# Patient Record
Sex: Female | Born: 1990 | Race: White | Hispanic: No | Marital: Married | Smoking: Never smoker
Health system: Southern US, Community
[De-identification: ages and names within clinical notes are randomized; demographics above are authoritative.]

## PROBLEM LIST (undated history)

## (undated) DIAGNOSIS — J45909 Unspecified asthma, uncomplicated: Secondary | ICD-10-CM

## (undated) HISTORY — PX: ADENOIDECTOMY: SUR15

## (undated) HISTORY — PX: TONSILLECTOMY: SUR1361

## (undated) HISTORY — DX: Unspecified asthma, uncomplicated: J45.909

---

## 2019-08-02 NOTE — L&D Delivery Note (Signed)
OB/GYN Faculty Practice Delivery Note  Lori Harris is a 29 y.o. G1P0 s/p vaginal delivery at [redacted]w[redacted]d. She was admitted for IOL secondary to chTN with superimposed preeclampsia with severe features (by blood pressures).   ROM: 14h 23m with clear fluid GBS Status: negative Maximum Maternal Temperature: 98.25F  Labor Progress: On arrival to L&D, pt received cytotec and FB placement for cervical ripening. Magnesium was started given persistent severe range blood pressures shortly after arrival. Pt was transitioned to pitocin at 0100 on 05/12/20 and then AROM for clear fluid was performed at 0715. Pt continued to progress well and cervical exam was found to be complete at 1244. She then had an uncomplicated delivery as noted below.   Delivery Date/Time: 2206 on 05/12/20 Delivery: Called to room and patient was complete and pushing. Head delivered LOA. No nuchal cord present. Shoulder and body delivered in usual fashion. Infant with spontaneous cry, placed on mother's abdomen, dried and stimulated. Cord clamped x 2 after 1-minute delay, and cut by FOB under my direct supervision. Cord blood drawn. Placenta delivered spontaneously with gentle cord traction. Fundus firm with massage and Pitocin. Labia, perineum, vagina, and cervix were inspected, notable for a second degree perineal laceration. Administered rectal cytotec in addition to postpartum pitocin s/p laceration repair given slight oozing in s/o IV magnesium.  Placenta: intact with trailing membranes, 2-vessel cord, sent to L&D Complications: none Lacerations: 2nd degree perineal laceration, s/p standard repair with 3-0 vicryl EBL: 450 ml Analgesia: epidural  Infant: female  APGARs  8 & 8  3260g  Lynnda Shields, MD OB/GYN Fellow, Faculty Practice

## 2019-10-22 ENCOUNTER — Encounter: Payer: Self-pay | Admitting: *Deleted

## 2019-10-23 ENCOUNTER — Other Ambulatory Visit: Payer: Self-pay

## 2019-10-23 ENCOUNTER — Ambulatory Visit (INDEPENDENT_AMBULATORY_CARE_PROVIDER_SITE_OTHER): Payer: 59 | Admitting: Obstetrics and Gynecology

## 2019-10-23 ENCOUNTER — Encounter: Payer: Self-pay | Admitting: Obstetrics and Gynecology

## 2019-10-23 DIAGNOSIS — Z1151 Encounter for screening for human papillomavirus (HPV): Secondary | ICD-10-CM

## 2019-10-23 DIAGNOSIS — Z3401 Encounter for supervision of normal first pregnancy, first trimester: Secondary | ICD-10-CM

## 2019-10-23 DIAGNOSIS — O99891 Other specified diseases and conditions complicating pregnancy: Secondary | ICD-10-CM | POA: Diagnosis not present

## 2019-10-23 DIAGNOSIS — Z124 Encounter for screening for malignant neoplasm of cervix: Secondary | ICD-10-CM

## 2019-10-23 DIAGNOSIS — R030 Elevated blood-pressure reading, without diagnosis of hypertension: Secondary | ICD-10-CM | POA: Insufficient documentation

## 2019-10-23 DIAGNOSIS — Z3A09 9 weeks gestation of pregnancy: Secondary | ICD-10-CM | POA: Diagnosis not present

## 2019-10-23 DIAGNOSIS — Z3490 Encounter for supervision of normal pregnancy, unspecified, unspecified trimester: Secondary | ICD-10-CM | POA: Insufficient documentation

## 2019-10-23 DIAGNOSIS — Z113 Encounter for screening for infections with a predominantly sexual mode of transmission: Secondary | ICD-10-CM | POA: Diagnosis not present

## 2019-10-23 MED ORDER — ALBUTEROL SULFATE HFA 108 (90 BASE) MCG/ACT IN AERS: 1.0000 | INHALATION_SPRAY | Freq: Four times a day (QID) | RESPIRATORY_TRACT | 3 refills | Status: DC | PRN

## 2019-10-23 NOTE — Progress Notes (Signed)
Pt does state that her periods are about every 58 days and may have 6 cycles per year.  Bedside U/S shows single IUP with FHT of 176 BPM and CRL measures 23.20mm  GA is 9w

## 2019-10-23 NOTE — Progress Notes (Signed)
BP recheck 118/79  P-106

## 2019-10-23 NOTE — Patient Instructions (Signed)

## 2019-10-23 NOTE — Progress Notes (Signed)
History:   Lori Harris is a 29 y.o. G1P0 at [redacted]w[redacted]d by early ultrasound being seen today for her first obstetrical visit.  Her obstetrical history is significant for elevated BP without the diagnosis of HTN., obesity. Patient does intend to breast feed. Pregnancy history fully reviewed. Has never had a pap smear.   Patient reports no complaints.  HISTORY: OB History  Gravida Para Term Preterm AB Living  1 0 0 0 0 0  SAB TAB Ectopic Multiple Live Births  0 0 0 0 0    # Outcome Date GA Lbr Len/2nd Weight Sex Delivery Anes PTL Lv  1 Current             Last pap smear was done Never  Past Medical History:  Diagnosis Date  . Asthma    Past Surgical History:  Procedure Laterality Date  . TONSILLECTOMY     Family History  Problem Relation Age of Onset  . Diabetes Mother   . Heart disease Mother   . Hypothyroidism Mother   . Diabetes Maternal Grandmother   . Spina bifida Maternal Grandfather   . Hypothyroidism Maternal Grandfather   . Hypothyroidism Maternal Aunt   . Hypothyroidism Maternal Aunt    Social History   Tobacco Use  . Smoking status: Never Smoker  . Smokeless tobacco: Never Used  Substance Use Topics  . Alcohol use: Not Currently  . Drug use: Never   No Known Allergies Current Outpatient Medications on File Prior to Visit  Medication Sig Dispense Refill  . cetirizine (ZYRTEC) 10 MG tablet Take 10 mg by mouth daily.    . Pediatric Multiple Vit-C-FA (CHILDRENS MULTIVITAMIN PO) Take by mouth.    . Prenatal Vit-Fe Fumarate-FA (PRENATAL VITAMINS PO) Take by mouth.     No current facility-administered medications on file prior to visit.    Review of Systems Pertinent items noted in HPI and remainder of comprehensive ROS otherwise negative. Physical Exam:   Vitals:   10/23/19 1032 10/23/19 1034 10/23/19 1126  BP: (!) 144/77  118/79  Pulse: (!) 105  (!) 106  Temp: 97.9 F (36.6 C)    Weight: 247 lb (112 kg)    Height:  6' (1.829 m)    Fetal  Heart Rate (bpm): 176  BP 118/79   Pulse (!) 106   Temp 97.9 F (36.6 C)   Ht 6' (1.829 m)   Wt 247 lb (112 kg)   LMP 08/05/2019   BMI 33.50 kg/m  Uterine Size: size equals dates  Pelvic Exam:    Perineum: No Hemorrhoids, Normal Perineum   Vulva: normal   Vagina:  normal mucosa, normal discharge, no palpable nodules   pH: Not done   Cervix: no bleeding following Pap, no cervical motion tenderness and no lesions  System: Breast:  No nipple retraction or dimpling, No nipple discharge or bleeding, No axillary or supraclavicular adenopathy, Normal to palpation without dominant masses   Skin: normal coloration and turgor, no rashes    Neurologic: negative   Extremities: normal strength, tone, and muscle mass   HEENT neck supple with midline trachea and thyroid without masses   Mouth/Teeth mucous membranes moist, pharynx normal without lesions   Neck supple and no masses   Cardiovascular: regular rate and rhythm, no murmurs or gallops   Respiratory:  appears well, vitals normal, no respiratory distress, acyanotic, normal RR, neck free of mass or lymphadenopathy, chest clear, no wheezing, crepitations, rhonchi, normal symmetric air entry   Abdomen: soft,  non-tender; bowel sounds normal; no masses,  no organomegaly   Urinary: urethral meatus normal   Assessment:    Pregnancy: G1P0 Patient Active Problem List   Diagnosis Date Noted  . Encounter for supervision of normal pregnancy 10/23/2019  . Elevated BP without diagnosis of hypertension 10/23/2019     Plan:   1. Encounter for supervision of normal first pregnancy in first trimester  - Obstetric panel - HIV antibody (with reflex) - Hepatitis C Antibody - Culture, OB Urine - Cytology - PAP( [place]) - TSH - Comprehensive metabolic panel - Protein / creatinine ratio, urine - HgB A1c - Hemoglobinopathy Evaluation - Patient to return to the office for labs in 2 weeks.   2. Elevated BP without diagnosis of  hypertension  Elevated BP's in the past per the patient Initial BP elevated, subsequent normal BASA daily Baseline labs today.    Initial labs drawn. Continue prenatal vitamins. Genetic Screening discussed, NIPS: requested. Ultrasound discussed; fetal anatomic survey: requested. Problem list reviewed and updated. The nature of [place] with multiple MDs and other Advanced Practice Providers was explained to patient; also emphasized that residents, students are part of our team. Routine obstetric precautions reviewed. No follow-ups on file.    Arlow Spiers, Artist Pais, [place] for [employer], [place]

## 2019-10-25 LAB — CYTOLOGY - PAP
Chlamydia: NEGATIVE
Comment: NEGATIVE
Comment: NEGATIVE
Comment: NORMAL
Diagnosis: NEGATIVE
Diagnosis: REACTIVE
High risk HPV: NEGATIVE
Neisseria Gonorrhea: NEGATIVE

## 2019-11-06 ENCOUNTER — Ambulatory Visit (INDEPENDENT_AMBULATORY_CARE_PROVIDER_SITE_OTHER): Payer: 59

## 2019-11-06 ENCOUNTER — Other Ambulatory Visit: Payer: Self-pay

## 2019-11-06 DIAGNOSIS — Z3490 Encounter for supervision of normal pregnancy, unspecified, unspecified trimester: Secondary | ICD-10-CM

## 2019-11-06 NOTE — Progress Notes (Signed)
Routine OB blood drawn. Pt will return at a later date for Panorama.

## 2019-11-08 LAB — HEMOGLOBINOPATHY EVALUATION
Fetal Hemoglobin Testing: <1 % (ref 0.0–1.9)
HCT: 41.2 % (ref 35.0–45.0)
Hemoglobin A2 - HGBRFX: 2.5 % (ref 2.2–3.2)
Hemoglobin: 13.3 g/dL (ref 11.7–15.5)
Hgb A: 97.5 % (ref >96.0)
MCH: 30.4 pg (ref 27.0–33.0)
MCV: 94.3 fL (ref 80.0–100.0)
RBC: 4.37 10*6/uL (ref 3.80–5.10)
RDW: 13 % (ref 11.0–15.0)

## 2019-11-08 LAB — OBSTETRIC PANEL
Absolute Monocytes: 231 cells/uL (ref 200–950)
Antibody Screen: NOT DETECTED
Basophils Absolute: 9 cells/uL (ref 0–200)
Basophils Relative: 0.1 %
Eosinophils Absolute: 53 cells/uL (ref 15–500)
Eosinophils Relative: 0.6 %
HCT: 38.8 % (ref 35.0–45.0)
Hemoglobin: 13 g/dL (ref 11.7–15.5)
Hepatitis B Surface Ag: NONREACTIVE
Lymphs Abs: 1513 cells/uL (ref 850–3900)
MCH: 30.1 pg (ref 27.0–33.0)
MCHC: 33.5 g/dL (ref 32.0–36.0)
MCV: 89.8 fL (ref 80.0–100.0)
MPV: 10.8 fL (ref 7.5–12.5)
Monocytes Relative: 2.6 %
Neutro Abs: 7093 cells/uL (ref 1500–7800)
Neutrophils Relative %: 79.7 %
Platelets: 239 10*3/uL (ref 140–400)
RBC: 4.32 10*6/uL (ref 3.80–5.10)
RDW: 12.3 % (ref 11.0–15.0)
RPR Ser Ql: NONREACTIVE
Rubella: <0.9 Index — ABNORMAL LOW
Total Lymphocyte: 17 %
WBC: 8.9 10*3/uL (ref 3.8–10.8)

## 2019-11-08 LAB — HEPATITIS C ANTIBODY
Hepatitis C Ab: NONREACTIVE
SIGNAL TO CUT-OFF: 0.01 (ref <1.00)

## 2019-11-08 LAB — CULTURE, OB URINE

## 2019-11-08 LAB — COMPREHENSIVE METABOLIC PANEL
AG Ratio: 1.7 (calc) (ref 1.0–2.5)
ALT: 24 U/L (ref 6–29)
AST: 17 U/L (ref 10–30)
Albumin: 4.3 g/dL (ref 3.6–5.1)
Alkaline phosphatase (APISO): 73 U/L (ref 31–125)
BUN/Creatinine Ratio: 9 (calc) (ref 6–22)
BUN: 5 mg/dL — ABNORMAL LOW (ref 7–25)
CO2: 24 mmol/L (ref 20–32)
Calcium: 9.7 mg/dL (ref 8.6–10.2)
Chloride: 100 mmol/L (ref 98–110)
Creat: 0.53 mg/dL (ref 0.50–1.10)
Globulin: 2.6 g/dL (calc) (ref 1.9–3.7)
Glucose, Bld: 117 mg/dL — ABNORMAL HIGH (ref 65–99)
Potassium: 3.8 mmol/L (ref 3.5–5.3)
Sodium: 134 mmol/L — ABNORMAL LOW (ref 135–146)
Total Bilirubin: 0.5 mg/dL (ref 0.2–1.2)
Total Protein: 6.9 g/dL (ref 6.1–8.1)

## 2019-11-08 LAB — HIV ANTIBODY (ROUTINE TESTING W REFLEX): HIV 1&2 Ab, 4th Generation: NONREACTIVE

## 2019-11-08 LAB — HEMOGLOBIN A1C
Hgb A1c MFr Bld: 4.6 % of total Hgb (ref <5.7)
Mean Plasma Glucose: 85 (calc)
eAG (mmol/L): 4.7 (calc)

## 2019-11-08 LAB — PROTEIN / CREATININE RATIO, URINE
Creatinine, Urine: 189 mg/dL (ref 20–275)
Protein/Creat Ratio: 275 mg/g creat — ABNORMAL HIGH (ref 21–161)
Protein/Creatinine Ratio: 0.275 mg/mg creat — ABNORMAL HIGH (ref 0.021–0.16)
Total Protein, Urine: 52 mg/dL — ABNORMAL HIGH (ref 5–24)

## 2019-11-08 LAB — TSH: TSH: 1.6 mIU/L

## 2019-11-08 LAB — URINE CULTURE, OB REFLEX

## 2019-11-17 ENCOUNTER — Encounter: Payer: Self-pay | Admitting: Obstetrics and Gynecology

## 2019-11-17 DIAGNOSIS — Z283 Underimmunization status: Secondary | ICD-10-CM | POA: Insufficient documentation

## 2019-11-17 DIAGNOSIS — O99891 Other specified diseases and conditions complicating pregnancy: Secondary | ICD-10-CM | POA: Insufficient documentation

## 2019-11-17 DIAGNOSIS — O09899 Supervision of other high risk pregnancies, unspecified trimester: Secondary | ICD-10-CM | POA: Insufficient documentation

## 2019-11-20 ENCOUNTER — Ambulatory Visit (INDEPENDENT_AMBULATORY_CARE_PROVIDER_SITE_OTHER): Payer: 59 | Admitting: Obstetrics and Gynecology

## 2019-11-20 ENCOUNTER — Other Ambulatory Visit: Payer: Self-pay

## 2019-11-20 VITALS — BP 129/80 | HR 100 | Temp 98.1°F | Wt 244.0 lb

## 2019-11-20 DIAGNOSIS — Z3401 Encounter for supervision of normal first pregnancy, first trimester: Secondary | ICD-10-CM

## 2019-11-20 NOTE — Progress Notes (Signed)
   PRENATAL VISIT NOTE  Subjective:  Lori Harris is a 29 y.o. G1P0 at [redacted]w[redacted]d being seen today for ongoing prenatal care.  She is currently monitored for the following issues for this low-risk pregnancy and has Encounter for supervision of normal pregnancy; Elevated BP without diagnosis of hypertension; and Rubella non-immune status, antepartum on their problem list.  Patient reports no complaints.  Contractions: Not present. Vag. Bleeding: None.  Movement: Absent. Denies leaking of fluid.   The following portions of the patient's history were reviewed and updated as appropriate: allergies, current medications, past family history, past medical history, past social history, past surgical history and problem list.   Objective:   Vitals:   11/20/19 0930  BP: 129/80  Pulse: 100  Temp: 98.1 F (36.7 C)  Weight: 244 lb (110.7 kg)    Fetal Status: Fetal Heart Rate (bpm): 161   Movement: Absent     General:  Alert, oriented and cooperative. Patient is in no acute distress.  Skin: Skin is warm and dry. No rash noted.   Cardiovascular: Normal heart rate noted  Respiratory: Normal respiratory effort, no problems with respiration noted  Abdomen: Soft, gravid, appropriate for gestational age.  Pain/Pressure: Absent     Pelvic: Cervical exam deferred        Extremities: Normal range of motion.  Edema: None  Mental Status: Normal mood and affect. Normal behavior. Normal judgment and thought content.   Assessment and Plan:  Pregnancy: G1P0 at [redacted]w[redacted]d  1. Encounter for supervision of normal first pregnancy in first trimester  - [place] MFM OB DETAIL +14 WK; Future - NIPS/[place] today   Preterm labor symptoms and general obstetric precautions including but not limited to vaginal bleeding, contractions, leaking of fluid and fetal movement were reviewed in detail with the patient. Please refer to After Visit Summary for other counseling recommendations.   Return in about 4 weeks (around  12/18/2019) for [place] order placed .  No future appointments.  Venia Carbon, NP

## 2019-11-28 DIAGNOSIS — Z3401 Encounter for supervision of normal first pregnancy, first trimester: Secondary | ICD-10-CM

## 2019-12-17 ENCOUNTER — Other Ambulatory Visit: Payer: Self-pay

## 2019-12-17 ENCOUNTER — Ambulatory Visit (INDEPENDENT_AMBULATORY_CARE_PROVIDER_SITE_OTHER): Payer: 59 | Admitting: Advanced Practice Midwife

## 2019-12-17 VITALS — BP 116/74 | HR 112 | Temp 97.5°F | Ht 60.0 in | Wt 244.0 lb

## 2019-12-17 DIAGNOSIS — Z3A16 16 weeks gestation of pregnancy: Secondary | ICD-10-CM

## 2019-12-17 DIAGNOSIS — E669 Obesity, unspecified: Secondary | ICD-10-CM | POA: Diagnosis not present

## 2019-12-17 DIAGNOSIS — O99212 Obesity complicating pregnancy, second trimester: Secondary | ICD-10-CM | POA: Diagnosis not present

## 2019-12-17 DIAGNOSIS — Z3402 Encounter for supervision of normal first pregnancy, second trimester: Secondary | ICD-10-CM

## 2019-12-17 MED ORDER — ASPIRIN EC 81 MG PO TBEC: 81.0000 mg | DELAYED_RELEASE_TABLET | Freq: Every day | ORAL | 5 refills | Status: DC

## 2019-12-17 NOTE — Progress Notes (Signed)
   PRENATAL VISIT NOTE  Subjective:  Lori Harris is a 29 y.o. G1P0 at [redacted]w[redacted]d being seen today for ongoing prenatal care.  She is currently monitored for the following issues for this low-risk pregnancy and has Encounter for supervision of normal pregnancy; Elevated BP without diagnosis of hypertension; and Rubella non-immune status, antepartum on their problem list.  Patient reports no complaints.  Contractions: Regular. Vag. Bleeding: None.  Movement: Absent. Denies leaking of fluid.   The following portions of the patient's history were reviewed and updated as appropriate: allergies, current medications, past family history, past medical history, past social history, past surgical history and problem list.   Objective:   Vitals:   12/17/19 1024 12/17/19 1027  BP: 116/74   Pulse: (!) 112   Temp: (!) 97.5 F (36.4 C)   Weight: 244 lb (110.7 kg)   Height:  5' (1.524 m)    Fetal Status: Fetal Heart Rate (bpm): 156   Movement: Absent     General:  Alert, oriented and cooperative. Patient is in no acute distress.  Skin: Skin is warm and dry. No rash noted.   Cardiovascular: Normal heart rate noted  Respiratory: Normal respiratory effort, no problems with respiration noted  Abdomen: Soft, gravid, appropriate for gestational age.  Pain/Pressure: Absent     Pelvic: Cervical exam deferred        Extremities: Normal range of motion.  Edema: None  Mental Status: Normal mood and affect. Normal behavior. Normal judgment and thought content.   Assessment and Plan:  Pregnancy: G1P0 at [redacted]w[redacted]d 1. Encounter for supervision of normal first pregnancy in second trimester --Pt to complete serum genetic screening today. - Alpha fetoprotein, maternal  2. Obesity affecting pregnancy in second trimester --Discussed risk factors for PEC, baby ASA started.  Baseline labs done at initial visit with P/C ratio 0.28.  HTN at first visit, none at subsequent visits. - aspirin EC 81 MG tablet; Take 1  tablet (81 mg total) by mouth daily.  Dispense: 30 tablet; Refill: 5  Preterm labor symptoms and general obstetric precautions including but not limited to vaginal bleeding, contractions, leaking of fluid and fetal movement were reviewed in detail with the patient. Please refer to After Visit Summary for other counseling recommendations.   Return in about 4 weeks (around 01/14/2020).  Future Appointments  Date Time Provider Department Center  01/01/2020 10:30 AM [facility] NURSE [facility]  01/01/2020 10:30 AM [facility] US3 [facility]  01/14/2020 10:35 AM [place], Wilmer Floor, CNM [facility]    Sharen Counter, CNM

## 2019-12-26 LAB — ALPHA FETOPROTEIN, MATERNAL
AFP MoM: 1.29
AFP, Serum: 36.1 ng/mL
Calc'd Gestational Age: 16.9 weeks
Maternal Wt: 244 [lb_av]
Risk for ONTD: 1
Twins-AFP: 1

## 2019-12-26 LAB — TIQ-AOE

## 2020-01-01 ENCOUNTER — Ambulatory Visit: Payer: 59 | Admitting: *Deleted

## 2020-01-01 ENCOUNTER — Other Ambulatory Visit: Payer: Self-pay

## 2020-01-01 ENCOUNTER — Other Ambulatory Visit: Payer: Self-pay | Admitting: *Deleted

## 2020-01-01 ENCOUNTER — Ambulatory Visit (HOSPITAL_COMMUNITY): Payer: 59 | Attending: Obstetrics and Gynecology

## 2020-01-01 VITALS — BP 129/76 | HR 105

## 2020-01-01 DIAGNOSIS — Z3A19 19 weeks gestation of pregnancy: Secondary | ICD-10-CM

## 2020-01-01 DIAGNOSIS — O132 Gestational [pregnancy-induced] hypertension without significant proteinuria, second trimester: Secondary | ICD-10-CM | POA: Diagnosis not present

## 2020-01-01 DIAGNOSIS — Z283 Underimmunization status: Secondary | ICD-10-CM | POA: Insufficient documentation

## 2020-01-01 DIAGNOSIS — O99212 Obesity complicating pregnancy, second trimester: Secondary | ICD-10-CM

## 2020-01-01 DIAGNOSIS — Z362 Encounter for other antenatal screening follow-up: Secondary | ICD-10-CM

## 2020-01-01 DIAGNOSIS — Z363 Encounter for antenatal screening for malformations: Secondary | ICD-10-CM

## 2020-01-01 DIAGNOSIS — E669 Obesity, unspecified: Secondary | ICD-10-CM

## 2020-01-01 DIAGNOSIS — Z3401 Encounter for supervision of normal first pregnancy, first trimester: Secondary | ICD-10-CM | POA: Diagnosis present

## 2020-01-01 DIAGNOSIS — O09899 Supervision of other high risk pregnancies, unspecified trimester: Secondary | ICD-10-CM

## 2020-01-01 DIAGNOSIS — O99891 Other specified diseases and conditions complicating pregnancy: Secondary | ICD-10-CM | POA: Diagnosis present

## 2020-01-01 DIAGNOSIS — Z8279 Family history of other congenital malformations, deformations and chromosomal abnormalities: Secondary | ICD-10-CM

## 2020-01-14 ENCOUNTER — Telehealth (INDEPENDENT_AMBULATORY_CARE_PROVIDER_SITE_OTHER): Payer: 59 | Admitting: Advanced Practice Midwife

## 2020-01-14 DIAGNOSIS — R030 Elevated blood-pressure reading, without diagnosis of hypertension: Secondary | ICD-10-CM

## 2020-01-14 DIAGNOSIS — Z283 Underimmunization status: Secondary | ICD-10-CM

## 2020-01-14 DIAGNOSIS — O99212 Obesity complicating pregnancy, second trimester: Secondary | ICD-10-CM

## 2020-01-14 DIAGNOSIS — Z2839 Other underimmunization status: Secondary | ICD-10-CM

## 2020-01-14 DIAGNOSIS — Z3A20 20 weeks gestation of pregnancy: Secondary | ICD-10-CM

## 2020-01-14 DIAGNOSIS — Z3490 Encounter for supervision of normal pregnancy, unspecified, unspecified trimester: Secondary | ICD-10-CM

## 2020-01-14 DIAGNOSIS — O09899 Supervision of other high risk pregnancies, unspecified trimester: Secondary | ICD-10-CM

## 2020-01-14 DIAGNOSIS — E669 Obesity, unspecified: Secondary | ICD-10-CM

## 2020-01-14 DIAGNOSIS — O99891 Other specified diseases and conditions complicating pregnancy: Secondary | ICD-10-CM

## 2020-01-14 NOTE — Progress Notes (Signed)
OBSTETRICS PRENATAL VIRTUAL VISIT ENCOUNTER NOTE  Provider location: [facility] [place]   I connected with Sharyn Creamer on 01/14/20 at 10:35 AM EDT by MyChart Video Encounter at home and verified that I am speaking with the correct person using two identifiers.   I discussed the limitations, risks, security and privacy concerns of performing an evaluation and management service virtually and the availability of in person appointments. I also discussed with the patient that there may be a patient responsible charge related to this service. The patient expressed understanding and agreed to proceed. Subjective:  Lori Harris is a 29 y.o. G1P0 at [redacted]w[redacted]d being seen today for ongoing prenatal care.  She is currently monitored for the following issues for this low-risk pregnancy and has Encounter for supervision of normal pregnancy; Elevated BP without diagnosis of hypertension; Rubella non-immune status, antepartum; and Obesity affecting pregnancy in second trimester on their problem list.  Patient reports nausea.  Contractions: Not present. Vag. Bleeding: None.   . Denies any leaking of fluid.   The following portions of the patient's history were reviewed and updated as appropriate: allergies, current medications, past family history, past medical history, past social history, past surgical history and problem list.   Objective:  There were no vitals filed for this visit.  Fetal Status:           General:  Alert, oriented and cooperative. Patient is in no acute distress.  Respiratory: Normal respiratory effort, no problems with respiration noted  Mental Status: Normal mood and affect. Normal behavior. Normal judgment and thought content.  Rest of physical exam deferred due to type of encounter  Imaging: [place] MFM OB DETAIL +14 WK  Result Date: 01/01/2020 ----------------------------------------------------------------------  OBSTETRICS REPORT                        (Signed Final 01/01/2020 12:02 pm) ---------------------------------------------------------------------- Patient Info  ID #:       [id number]                          D.O.B.:  1991/03/19 (29 yrs)  Name:       Lori Harris             Visit Date: 01/01/2020 10:31 am ---------------------------------------------------------------------- Performed By  Attending:        Ma Rings MD         Ref. Address:     [address] [place] [address], [place]  Performed By:     Tommie Raymond BS,       [facility]                    RDMS, RVT                                Fetal Care  Referred By:      Everardo All ---------------------------------------------------------------------- Orders  #  Description  Code        Ordered By  1  [place] MFM OB DETAIL +14 WK               [id number]    JENNIFER [facility] ----------------------------------------------------------------------  #  Order #                     Accession #                Episode #  1  [id number]                   [id number]                 [id number] ---------------------------------------------------------------------- Indications  Elevated blood pressure affecting pregnancy    O13.2  in second trimester  Obesity complicating pregnancy, second         O99.212  trimester  [redacted] weeks gestation of pregnancy                Z3A.19  Encounter for antenatal screening for          Z36.3  malformations (LR NIPS)  Family history of congenital anomaly (spina    Z82.79  bifida, hypospadias) ---------------------------------------------------------------------- Fetal Evaluation  Num Of Fetuses:         1  Fetal Heart Rate(bpm):  155  Cardiac Activity:       Observed  Presentation:           Variable  Placenta:               Posterior Fundal  P. Cord Insertion:      Visualized, central  Amniotic Fluid  AFI FV:      Within normal limits                               Largest Pocket(cm)                              4.6 ---------------------------------------------------------------------- Biometry  BPD:      46.4  mm     G. Age:  20w 0d         88  %    CI:        75.53   %    70 - 86                                                          FL/HC:      17.5   %    16.1 - 18.3  HC:      169.3  mm     G. Age:  19w 4d         70  %    HC/AC:      1.08        1.09 - 1.39  AC:      156.2  mm     G. Age:  20w 5d         93  %    FL/BPD:     63.8   %  FL:       29.6  mm     G. Age:  19w 1d  48  %    FL/AC:      19.0   %    20 - 24  HUM:      28.8  mm     G. Age:  19w 2d         59  %  CER:      20.7  mm     G. Age:  19w 5d         66  %  NFT:       3.9  mm  LV:        5.8  mm  CM:        3.5  mm  Est. FW:     324  gm    0 lb 11 oz      93  % ---------------------------------------------------------------------- OB History  Blood Type:   A+  Gravidity:    1         Term:   0        Prem:   0        SAB:   0  TOP:          0       Ectopic:  0        Living: 0 ---------------------------------------------------------------------- Gestational Age  LMP:           21w 2d        Date:  08/05/19                 EDD:   05/11/20  U/S Today:     19w 6d                                        EDD:   05/21/20  Best:          19w 0d     Det. By:  Previous Ultrasound      EDD:   05/27/20                                      (10/23/19) ---------------------------------------------------------------------- Anatomy  Cranium:               Appears normal         Aortic Arch:            Appears normal  Cavum:                 Appears normal         Ductal Arch:            Appears normal  Ventricles:            Appears normal         Diaphragm:              Appears normal  Choroid Plexus:        Appears normal         Stomach:                Appears normal, left  sided  Cerebellum:            Appears normal         Abdomen:                 Appears normal  Posterior Fossa:       Appears normal         Abdominal Wall:         Appears nml (cord                                                                        insert, abd wall)  Nuchal Fold:           Appears normal         Cord Vessels:           Appears normal (3                                                                        vessel cord)  Face:                  Appears normal         Kidneys:                Appear normal                         (orbits and profile)  Lips:                  Appears normal         Bladder:                Appears normal  Thoracic:              Appears normal         Spine:                  Not well visualized  Heart:                 Appears normal         Upper Extremities:      Appears normal                         (4CH, axis, and                         situs)  RVOT:                  Appears normal         Lower Extremities:      Appears normal  LVOT:                  Appears normal  Other:  Fetus appears to be a female. Nasal bone visualized. Heels and 5th          digit  visualized. Open hands visualized. Technically difficult due to          maternal habitus and fetal position. ---------------------------------------------------------------------- Cervix Uterus Adnexa  Cervix  Length:           4.02  cm.  Normal appearance by transabdominal scan.  Uterus  No abnormality visualized.  Right Ovary  Within normal limits. No adnexal mass visualized.  Left Ovary  Within normal limits. No adnexal mass visualized.  Cul De Sac  No free fluid seen.  Adnexa  No abnormality visualized. ---------------------------------------------------------------------- Comments  This patient was seen for a detailed fetal anatomy scan due  to maternal obesity and history of chronic hypertension that is  not currently treated with any medications.  She denies any other significant past medical history and  denies any problems in her current pregnancy.  She had a cell free DNA test  earlier in her pregnancy which  indicated a low risk for trisomy 79, 81, and 13. A female fetus is  predicted.  She was informed that the fetal growth and amniotic fluid  level were appropriate for her gestational age.  There were no obvious fetal anomalies noted on today's  ultrasound exam.  However, the views of the fetal anatomy  were limited today due to maternal body habitus and the fetal  position..  The patient was informed that anomalies may be missed due  to technical limitations. If the fetus is in a suboptimal position  or maternal habitus is increased, visualization of the fetus in  the maternal uterus may be impaired.  Due to her history of chronic hypertension, we will continue to  follow her with serial growth ultrasounds.  A follow-up exam was scheduled in 4 weeks. ----------------------------------------------------------------------                   Johnell Comings, MD Electronically Signed Final Report   01/01/2020 12:02 pm ----------------------------------------------------------------------   Assessment and Plan:  Pregnancy: G1P0 at [redacted]w[redacted]d 1. Rubella non-immune status, antepartum   2. Encounter for supervision of normal pregnancy, antepartum, unspecified gravidity --Pt reports good fetal movement, denies cramping, LOF, or vaginal bleeding --Anticipatory guidance about next visits/weeks of pregnancy given. --Reviewed normal anatomy US, follow up for growth in 2 weeks  3. Elevated BP without diagnosis of hypertension --No at home BPs.  Next visit in office for BP check.   4. Obesity affecting pregnancy in second trimester --On Baby ASA, growth [place] Q 4-6 weeks  Preterm labor symptoms and general obstetric precautions including but not limited to vaginal bleeding, contractions, leaking of fluid and fetal movement were reviewed in detail with the patient. I discussed the assessment and treatment plan with the patient. The patient was provided an opportunity to ask questions and all were  answered. The patient agreed with the plan and demonstrated an understanding of the instructions. The patient was advised to call back or seek an in-person office evaluation/go to [facility] at [facility] for any urgent or concerning symptoms. Please refer to After Visit Summary for other counseling recommendations.   I provided 10 minutes of face-to-face time during this encounter.  Return in about 4 weeks (around 02/11/2020).  Future Appointments  Date Time Provider [place]  01/29/2020 11:15 AM [facility] NURSE [facility]  01/29/2020 11:15 AM [facility] US2 [facility] [place], [place] for [employer], [place]

## 2020-01-14 NOTE — Progress Notes (Signed)
NO Babyscripts BP's or wts documented in App.  Pt will need to be in person visits

## 2020-01-29 ENCOUNTER — Ambulatory Visit: Payer: 59 | Admitting: *Deleted

## 2020-01-29 ENCOUNTER — Other Ambulatory Visit: Payer: Self-pay

## 2020-01-29 ENCOUNTER — Ambulatory Visit: Payer: 59 | Attending: Obstetrics and Gynecology

## 2020-01-29 VITALS — BP 129/73 | HR 110

## 2020-01-29 DIAGNOSIS — Z8279 Family history of other congenital malformations, deformations and chromosomal abnormalities: Secondary | ICD-10-CM

## 2020-01-29 DIAGNOSIS — Z3A23 23 weeks gestation of pregnancy: Secondary | ICD-10-CM

## 2020-01-29 DIAGNOSIS — O99212 Obesity complicating pregnancy, second trimester: Secondary | ICD-10-CM | POA: Diagnosis not present

## 2020-01-29 DIAGNOSIS — O99210 Obesity complicating pregnancy, unspecified trimester: Secondary | ICD-10-CM | POA: Diagnosis present

## 2020-01-29 DIAGNOSIS — O09899 Supervision of other high risk pregnancies, unspecified trimester: Secondary | ICD-10-CM

## 2020-01-29 DIAGNOSIS — Z283 Underimmunization status: Secondary | ICD-10-CM | POA: Insufficient documentation

## 2020-01-29 DIAGNOSIS — Z362 Encounter for other antenatal screening follow-up: Secondary | ICD-10-CM | POA: Diagnosis not present

## 2020-01-29 DIAGNOSIS — O10012 Pre-existing essential hypertension complicating pregnancy, second trimester: Secondary | ICD-10-CM

## 2020-01-29 DIAGNOSIS — Z363 Encounter for antenatal screening for malformations: Secondary | ICD-10-CM

## 2020-01-29 DIAGNOSIS — E669 Obesity, unspecified: Secondary | ICD-10-CM

## 2020-01-29 DIAGNOSIS — O99891 Other specified diseases and conditions complicating pregnancy: Secondary | ICD-10-CM | POA: Diagnosis present

## 2020-01-29 DIAGNOSIS — Z2839 Other underimmunization status: Secondary | ICD-10-CM

## 2020-02-06 ENCOUNTER — Other Ambulatory Visit: Payer: Self-pay | Admitting: *Deleted

## 2020-02-06 DIAGNOSIS — O99212 Obesity complicating pregnancy, second trimester: Secondary | ICD-10-CM

## 2020-02-11 ENCOUNTER — Other Ambulatory Visit: Payer: Self-pay

## 2020-02-11 ENCOUNTER — Ambulatory Visit (INDEPENDENT_AMBULATORY_CARE_PROVIDER_SITE_OTHER): Payer: 59 | Admitting: Advanced Practice Midwife

## 2020-02-11 VITALS — BP 141/75 | HR 102 | Wt 245.0 lb

## 2020-02-11 DIAGNOSIS — Z3490 Encounter for supervision of normal pregnancy, unspecified, unspecified trimester: Secondary | ICD-10-CM

## 2020-02-11 DIAGNOSIS — Z3A24 24 weeks gestation of pregnancy: Secondary | ICD-10-CM

## 2020-02-11 DIAGNOSIS — J45909 Unspecified asthma, uncomplicated: Secondary | ICD-10-CM

## 2020-02-11 DIAGNOSIS — O10912 Unspecified pre-existing hypertension complicating pregnancy, second trimester: Secondary | ICD-10-CM

## 2020-02-11 DIAGNOSIS — O10919 Unspecified pre-existing hypertension complicating pregnancy, unspecified trimester: Secondary | ICD-10-CM | POA: Insufficient documentation

## 2020-02-11 DIAGNOSIS — E669 Obesity, unspecified: Secondary | ICD-10-CM

## 2020-02-11 DIAGNOSIS — O99512 Diseases of the respiratory system complicating pregnancy, second trimester: Secondary | ICD-10-CM

## 2020-02-11 DIAGNOSIS — O99212 Obesity complicating pregnancy, second trimester: Secondary | ICD-10-CM

## 2020-02-11 NOTE — Progress Notes (Signed)
   PRENATAL VISIT NOTE  Subjective:  Lori Harris is a 29 y.o. G1P0 at [redacted]w[redacted]d being seen today for ongoing prenatal care.  She is currently monitored for the following issues for this low-risk pregnancy and has Encounter for supervision of normal pregnancy; Rubella non-immune status, antepartum; Obesity affecting pregnancy in second trimester; and Chronic hypertension affecting pregnancy on their problem list.  Patient reports SOB with exertion.  Contractions: Not present. Vag. Bleeding: None.  Movement: Present. Denies leaking of fluid.   The following portions of the patient's history were reviewed and updated as appropriate: allergies, current medications, past family history, past medical history, past social history, past surgical history and problem list.   Objective:   Vitals:   02/11/20 1100  BP: (!) 141/75  Pulse: (!) 102  Weight: 245 lb (111.1 kg)    Fetal Status: Fetal Heart Rate (bpm): 149 Fundal Height: 25 cm Movement: Present     General:  Alert, oriented and cooperative. Patient is in no acute distress.  Skin: Skin is warm and dry. No rash noted.   Cardiovascular: Normal heart rate noted  Respiratory: Normal respiratory effort, no problems with respiration noted  Abdomen: Soft, gravid, appropriate for gestational age.  Pain/Pressure: Absent     Pelvic: Cervical exam deferred        Extremities: Normal range of motion.  Edema: Trace  Mental Status: Normal mood and affect. Normal behavior. Normal judgment and thought content.   Assessment and Plan:  Pregnancy: G1P0 at [redacted]w[redacted]d 1. Encounter for supervision of normal pregnancy, antepartum, unspecified gravidity --Anticipatory guidance about next visits/weeks of pregnancy given.  2. Chronic hypertension affecting pregnancy --Pt with HTN at initial visit, normotensive since until today.  Borderline BP.   --Will add 24 week [place] and BP check in 1 week.   --No medications at this time but will continue to watch  3.  Asthma affecting pregnancy in second trimester --Pt with SOB and wheezing with exertion, walking up stairs or long walk at work.  Has rescue inhaler for mild intermittent asthma management but isn't using. --Lung sounds clear in office today, normal O2 sats --Discussed options, pt does not want inhaled steroid, causes canker sores in mouth.  Has used Singulair in past without problems. --For now, pt to watch symptoms, treat SOB with albuterol inhaler to see if improvement.   --F/U at next visit with MD to see if asthma needs further management.  Preterm labor symptoms and general obstetric precautions including but not limited to vaginal bleeding, contractions, leaking of fluid and fetal movement were reviewed in detail with the patient. Please refer to After Visit Summary for other counseling recommendations.   Return in about 4 weeks (around 03/10/2020).  Future Appointments  Date Time Provider Department Center  02/18/2020 10:15 AM [facility] NURSE [facility]  02/26/2020 10:30 AM [facility] NURSE [facility]  02/26/2020 10:45 AM [facility] US4 [facility]  03/02/2020 10:00 AM Leggett, Fredrich Romans, MD [facility]    Sharen Counter, CNM

## 2020-02-18 ENCOUNTER — Other Ambulatory Visit: Payer: Self-pay

## 2020-02-18 ENCOUNTER — Ambulatory Visit (INDEPENDENT_AMBULATORY_CARE_PROVIDER_SITE_OTHER): Payer: 59

## 2020-02-18 DIAGNOSIS — Z3400 Encounter for supervision of normal first pregnancy, unspecified trimester: Secondary | ICD-10-CM

## 2020-02-18 DIAGNOSIS — O10919 Unspecified pre-existing hypertension complicating pregnancy, unspecified trimester: Secondary | ICD-10-CM

## 2020-02-18 DIAGNOSIS — Z3A00 Weeks of gestation of pregnancy not specified: Secondary | ICD-10-CM

## 2020-02-18 DIAGNOSIS — Z2839 Other underimmunization status: Secondary | ICD-10-CM

## 2020-02-18 DIAGNOSIS — Z0131 Encounter for examination of blood pressure with abnormal findings: Secondary | ICD-10-CM

## 2020-02-18 NOTE — Progress Notes (Signed)
Pt here for BP check. Pt has no complaints today. Pt denies headache or visual changes. BP 134/80. Pt will return on 03/02/20 for ROB visit.

## 2020-02-20 ENCOUNTER — Other Ambulatory Visit: Payer: Self-pay

## 2020-02-20 ENCOUNTER — Encounter: Payer: Self-pay | Admitting: Obstetrics and Gynecology

## 2020-02-20 ENCOUNTER — Ambulatory Visit (INDEPENDENT_AMBULATORY_CARE_PROVIDER_SITE_OTHER): Payer: 59 | Admitting: Obstetrics and Gynecology

## 2020-02-20 VITALS — BP 141/80 | HR 89 | Temp 97.2°F | Wt 245.0 lb

## 2020-02-20 DIAGNOSIS — O10919 Unspecified pre-existing hypertension complicating pregnancy, unspecified trimester: Secondary | ICD-10-CM

## 2020-02-20 DIAGNOSIS — Z3400 Encounter for supervision of normal first pregnancy, unspecified trimester: Secondary | ICD-10-CM

## 2020-02-20 DIAGNOSIS — Z2839 Other underimmunization status: Secondary | ICD-10-CM

## 2020-02-20 DIAGNOSIS — O99891 Other specified diseases and conditions complicating pregnancy: Secondary | ICD-10-CM

## 2020-02-20 DIAGNOSIS — O10912 Unspecified pre-existing hypertension complicating pregnancy, second trimester: Secondary | ICD-10-CM

## 2020-02-20 DIAGNOSIS — O09899 Supervision of other high risk pregnancies, unspecified trimester: Secondary | ICD-10-CM

## 2020-02-20 DIAGNOSIS — R309 Painful micturition, unspecified: Secondary | ICD-10-CM

## 2020-02-20 DIAGNOSIS — O99212 Obesity complicating pregnancy, second trimester: Secondary | ICD-10-CM

## 2020-02-20 DIAGNOSIS — Z283 Underimmunization status: Secondary | ICD-10-CM

## 2020-02-20 DIAGNOSIS — E669 Obesity, unspecified: Secondary | ICD-10-CM

## 2020-02-20 DIAGNOSIS — Z3A26 26 weeks gestation of pregnancy: Secondary | ICD-10-CM

## 2020-02-20 LAB — POCT URINALYSIS DIPSTICK
Bilirubin, UA: NEGATIVE
Blood, UA: NEGATIVE
Glucose, UA: NEGATIVE
Nitrite, UA: NEGATIVE
Protein, UA: NEGATIVE
Spec Grav, UA: 1.025 (ref 1.010–1.025)
Urobilinogen, UA: NEGATIVE E.U./dL — AB
pH, UA: 7 (ref 5.0–8.0)

## 2020-02-20 NOTE — Progress Notes (Signed)
   PRENATAL VISIT NOTE  Subjective:  Lori Harris is a 29 y.o. G1P0 at 92w1dbeing seen today for ongoing prenatal care.  She is currently monitored for the following issues for this high-risk pregnancy and has Encounter for supervision of normal pregnancy; Rubella non-immune status, antepartum; Obesity affecting pregnancy in second trimester; and Chronic hypertension affecting pregnancy on their problem list.  Patient reports back pain.  Contractions: Not present. Vag. Bleeding: None.  Movement: Present. Denies leaking of fluid.   The following portions of the patient's history were reviewed and updated as appropriate: allergies, current medications, past family history, past medical history, past social history, past surgical history and problem list.   Objective:   Vitals:   02/20/20 1304  BP: (!) 141/80  Pulse: 89  Temp: (!) 97.2 F (36.2 C)  Weight: (!) 245 lb (111.1 kg)    Fetal Status: Fetal Heart Rate (bpm): 152   Movement: Present     General:  Alert, oriented and cooperative. Patient is in no acute distress.  Skin: Skin is warm and dry. No rash noted.   Cardiovascular: Normal heart rate noted  Respiratory: Normal respiratory effort, no problems with respiration noted  Abdomen: Soft, gravid, appropriate for gestational age.  Pain/Pressure: Present     Pelvic: Cervical exam deferred        Extremities: Normal range of motion.  Edema: Trace  Mental Status: Normal mood and affect. Normal behavior. Normal judgment and thought content.   Assessment and Plan:  Pregnancy: G1P0 at 266w1d1. Painful urination - POCT Urinalysis Dipstick - Culture, OB Urine  2. Chronic hypertension affecting pregnancy BP stable, no meds Cont baby asa  3. Supervision of normal first pregnancy, antepartum  4. Obesity affecting pregnancy in second trimester  5. Rubella non-immune status, antepartum MMR post partum  Preterm labor symptoms and general obstetric precautions including  but not limited to vaginal bleeding, contractions, leaking of fluid and fetal movement were reviewed in detail with the patient. Please refer to After Visit Summary for other counseling recommendations.   Return in about 2 weeks (around 03/05/2020) for high OB, in person, 2 hr GTT, 3rd trim labs.  Future Appointments  Date Time Provider [place]7/22/2021  2:00 PM DaSloan LeiterMD [facility]/28/2021 10:30 AM [facility]/28/2021 10:45 AM [facility] US4 [facility]/09/2019 10:00 AM LeGuss BundeMD [facility]  KeSloan LeiterMD

## 2020-02-20 NOTE — Progress Notes (Signed)
Pt states she has had increase in lower back pain

## 2020-02-22 LAB — URINE CULTURE, OB REFLEX

## 2020-02-22 LAB — CULTURE, OB URINE

## 2020-02-26 ENCOUNTER — Ambulatory Visit: Payer: 59 | Admitting: *Deleted

## 2020-02-26 ENCOUNTER — Other Ambulatory Visit: Payer: Self-pay

## 2020-02-26 ENCOUNTER — Other Ambulatory Visit: Payer: Self-pay | Admitting: *Deleted

## 2020-02-26 ENCOUNTER — Ambulatory Visit: Payer: 59 | Attending: Obstetrics and Gynecology

## 2020-02-26 DIAGNOSIS — O99212 Obesity complicating pregnancy, second trimester: Secondary | ICD-10-CM | POA: Diagnosis not present

## 2020-02-26 DIAGNOSIS — Z362 Encounter for other antenatal screening follow-up: Secondary | ICD-10-CM

## 2020-02-26 DIAGNOSIS — E669 Obesity, unspecified: Secondary | ICD-10-CM

## 2020-02-26 DIAGNOSIS — O10919 Unspecified pre-existing hypertension complicating pregnancy, unspecified trimester: Secondary | ICD-10-CM

## 2020-02-26 DIAGNOSIS — Z8279 Family history of other congenital malformations, deformations and chromosomal abnormalities: Secondary | ICD-10-CM

## 2020-02-26 DIAGNOSIS — Z283 Underimmunization status: Secondary | ICD-10-CM | POA: Diagnosis present

## 2020-02-26 DIAGNOSIS — O09899 Supervision of other high risk pregnancies, unspecified trimester: Secondary | ICD-10-CM

## 2020-02-26 DIAGNOSIS — O99891 Other specified diseases and conditions complicating pregnancy: Secondary | ICD-10-CM | POA: Diagnosis present

## 2020-02-26 DIAGNOSIS — Z3A27 27 weeks gestation of pregnancy: Secondary | ICD-10-CM

## 2020-02-26 DIAGNOSIS — O10012 Pre-existing essential hypertension complicating pregnancy, second trimester: Secondary | ICD-10-CM

## 2020-03-02 ENCOUNTER — Encounter: Payer: 59 | Admitting: Obstetrics & Gynecology

## 2020-03-04 ENCOUNTER — Other Ambulatory Visit: Payer: Self-pay

## 2020-03-04 ENCOUNTER — Other Ambulatory Visit (INDEPENDENT_AMBULATORY_CARE_PROVIDER_SITE_OTHER): Payer: 59

## 2020-03-04 DIAGNOSIS — Z3402 Encounter for supervision of normal first pregnancy, second trimester: Secondary | ICD-10-CM

## 2020-03-04 DIAGNOSIS — Z23 Encounter for immunization: Secondary | ICD-10-CM | POA: Diagnosis not present

## 2020-03-05 LAB — 2HR GTT W 1 HR, CARPENTER, 75 G
Glucose, 1 Hr, Gest: 145 mg/dL (ref 65–179)
Glucose, 2 Hr, Gest: 130 mg/dL (ref 65–152)
Glucose, Fasting, Gest: 90 mg/dL (ref 65–91)

## 2020-03-05 LAB — CBC
HCT: 34.5 % — ABNORMAL LOW (ref 35.0–45.0)
Hemoglobin: 11.5 g/dL — ABNORMAL LOW (ref 11.7–15.5)
MCH: 29.5 pg (ref 27.0–33.0)
MCHC: 33.3 g/dL (ref 32.0–36.0)
MCV: 88.5 fL (ref 80.0–100.0)
MPV: 10.7 fL (ref 7.5–12.5)
Platelets: 259 10*3/uL (ref 140–400)
RBC: 3.9 10*6/uL (ref 3.80–5.10)
RDW: 13.6 % (ref 11.0–15.0)
WBC: 11.1 10*3/uL — ABNORMAL HIGH (ref 3.8–10.8)

## 2020-03-05 LAB — RPR: RPR Ser Ql: NONREACTIVE

## 2020-03-05 LAB — HIV ANTIBODY (ROUTINE TESTING W REFLEX): HIV 1&2 Ab, 4th Generation: NONREACTIVE

## 2020-03-09 ENCOUNTER — Encounter: Payer: Self-pay | Admitting: Obstetrics and Gynecology

## 2020-03-09 ENCOUNTER — Other Ambulatory Visit: Payer: Self-pay

## 2020-03-09 ENCOUNTER — Ambulatory Visit (INDEPENDENT_AMBULATORY_CARE_PROVIDER_SITE_OTHER): Payer: 59 | Admitting: Obstetrics and Gynecology

## 2020-03-09 VITALS — BP 125/71 | HR 112 | Wt 248.0 lb

## 2020-03-09 DIAGNOSIS — O99891 Other specified diseases and conditions complicating pregnancy: Secondary | ICD-10-CM

## 2020-03-09 DIAGNOSIS — Z3A28 28 weeks gestation of pregnancy: Secondary | ICD-10-CM

## 2020-03-09 DIAGNOSIS — O09899 Supervision of other high risk pregnancies, unspecified trimester: Secondary | ICD-10-CM

## 2020-03-09 DIAGNOSIS — Z283 Underimmunization status: Secondary | ICD-10-CM

## 2020-03-09 DIAGNOSIS — Z3493 Encounter for supervision of normal pregnancy, unspecified, third trimester: Secondary | ICD-10-CM

## 2020-03-09 DIAGNOSIS — O10919 Unspecified pre-existing hypertension complicating pregnancy, unspecified trimester: Secondary | ICD-10-CM

## 2020-03-09 MED ORDER — BREAST PUMP MISC: 1.0000 | 0 refills | Status: DC | PRN

## 2020-03-09 NOTE — Addendum Note (Signed)
Addended by: Kathie Dike on: 03/09/2020 04:36 PM   Modules accepted: Orders

## 2020-03-09 NOTE — Progress Notes (Signed)
° °  PRENATAL VISIT NOTE  Subjective:  Lori Harris is a 29 y.o. G1P0 at 48w5dbeing seen today for ongoing prenatal care.  She is currently monitored for the following issues for this high-risk pregnancy and has Encounter for supervision of normal pregnancy; Rubella non-immune status, antepartum; Obesity affecting pregnancy in second trimester; and Chronic hypertension affecting pregnancy on their problem list.  Patient reports no complaints.  Contractions: Not present. Vag. Bleeding: None.  Movement: Present. Denies leaking of fluid.   The following portions of the patient's history were reviewed and updated as appropriate: allergies, current medications, past family history, past medical history, past social history, past surgical history and problem list.   Objective:   Vitals:   03/09/20 1559  BP: 125/71  Pulse: (!) 112  Weight: 248 lb (112.5 kg)    Fetal Status: Fetal Heart Rate (bpm): 136   Movement: Present     General:  Alert, oriented and cooperative. Patient is in no acute distress.  Skin: Skin is warm and dry. No rash noted.   Cardiovascular: Normal heart rate noted  Respiratory: Normal respiratory effort, no problems with respiration noted  Abdomen: Soft, gravid, appropriate for gestational age.  Pain/Pressure: Absent     Pelvic: Cervical exam deferred        Extremities: Normal range of motion.  Edema: Trace  Mental Status: Normal mood and affect. Normal behavior. Normal judgment and thought content.   Assessment and Plan:  Pregnancy: G1P0 at 246w5d1. [redacted] weeks gestation of pregnancy  2. Chronic hypertension affecting pregnancy Stable no meds  3. Encounter for supervision of normal pregnancy in third trimester, unspecified gravidity Last growth 97th%tile F/u scheduled  4. Rubella non-immune status, antepartum MMR post partum  Preterm labor symptoms and general obstetric precautions including but not limited to vaginal bleeding, contractions, leaking of  fluid and fetal movement were reviewed in detail with the patient. Please refer to After Visit Summary for other counseling recommendations.   Return in about 2 weeks (around 03/23/2020) for high OB, in person.  Future Appointments  Date Time Provider [place]8/27/2021 11:00 AM [facility]8/27/2021 11:15 AM [facility] US2 [facility]  KeSloan LeiterMD

## 2020-03-27 ENCOUNTER — Ambulatory Visit: Payer: 59 | Attending: Obstetrics and Gynecology

## 2020-03-27 ENCOUNTER — Other Ambulatory Visit: Payer: Self-pay | Admitting: *Deleted

## 2020-03-27 ENCOUNTER — Other Ambulatory Visit: Payer: Self-pay

## 2020-03-27 ENCOUNTER — Ambulatory Visit: Payer: 59 | Admitting: *Deleted

## 2020-03-27 DIAGNOSIS — E669 Obesity, unspecified: Secondary | ICD-10-CM

## 2020-03-27 DIAGNOSIS — Z3A31 31 weeks gestation of pregnancy: Secondary | ICD-10-CM

## 2020-03-27 DIAGNOSIS — O99891 Other specified diseases and conditions complicating pregnancy: Secondary | ICD-10-CM | POA: Diagnosis present

## 2020-03-27 DIAGNOSIS — Z3493 Encounter for supervision of normal pregnancy, unspecified, third trimester: Secondary | ICD-10-CM | POA: Insufficient documentation

## 2020-03-27 DIAGNOSIS — O10919 Unspecified pre-existing hypertension complicating pregnancy, unspecified trimester: Secondary | ICD-10-CM

## 2020-03-27 DIAGNOSIS — Z283 Underimmunization status: Secondary | ICD-10-CM | POA: Insufficient documentation

## 2020-03-27 DIAGNOSIS — Z8279 Family history of other congenital malformations, deformations and chromosomal abnormalities: Secondary | ICD-10-CM

## 2020-03-27 DIAGNOSIS — O99213 Obesity complicating pregnancy, third trimester: Secondary | ICD-10-CM | POA: Diagnosis not present

## 2020-03-27 DIAGNOSIS — Z362 Encounter for other antenatal screening follow-up: Secondary | ICD-10-CM

## 2020-03-27 DIAGNOSIS — O09899 Supervision of other high risk pregnancies, unspecified trimester: Secondary | ICD-10-CM

## 2020-03-27 DIAGNOSIS — O10013 Pre-existing essential hypertension complicating pregnancy, third trimester: Secondary | ICD-10-CM

## 2020-03-30 ENCOUNTER — Other Ambulatory Visit: Payer: Self-pay

## 2020-03-30 ENCOUNTER — Encounter: Payer: Self-pay | Admitting: Obstetrics and Gynecology

## 2020-03-30 ENCOUNTER — Ambulatory Visit (INDEPENDENT_AMBULATORY_CARE_PROVIDER_SITE_OTHER): Payer: 59 | Admitting: Obstetrics and Gynecology

## 2020-03-30 VITALS — BP 122/81 | HR 113 | Wt 250.0 lb

## 2020-03-30 DIAGNOSIS — Z3493 Encounter for supervision of normal pregnancy, unspecified, third trimester: Secondary | ICD-10-CM

## 2020-03-30 DIAGNOSIS — Z3A31 31 weeks gestation of pregnancy: Secondary | ICD-10-CM

## 2020-03-30 DIAGNOSIS — Z283 Underimmunization status: Secondary | ICD-10-CM

## 2020-03-30 DIAGNOSIS — O10919 Unspecified pre-existing hypertension complicating pregnancy, unspecified trimester: Secondary | ICD-10-CM

## 2020-03-30 DIAGNOSIS — Z23 Encounter for immunization: Secondary | ICD-10-CM | POA: Diagnosis not present

## 2020-03-30 DIAGNOSIS — O99212 Obesity complicating pregnancy, second trimester: Secondary | ICD-10-CM

## 2020-03-30 DIAGNOSIS — O99891 Other specified diseases and conditions complicating pregnancy: Secondary | ICD-10-CM

## 2020-03-30 DIAGNOSIS — O09899 Supervision of other high risk pregnancies, unspecified trimester: Secondary | ICD-10-CM

## 2020-03-30 LAB — POCT URINALYSIS DIPSTICK OB
Glucose, UA: NEGATIVE
POC,PROTEIN,UA: NEGATIVE

## 2020-03-30 MED ORDER — BREAST PUMP MISC: 1.0000 | 0 refills | Status: AC | PRN

## 2020-03-30 NOTE — Addendum Note (Signed)
Addended by: Granville Lewis on: 03/30/2020 02:13 PM   Modules accepted: Orders

## 2020-03-30 NOTE — Addendum Note (Signed)
Addended by: Kathie Dike on: 03/30/2020 02:17 PM   Modules accepted: Orders

## 2020-03-30 NOTE — Progress Notes (Signed)
   PRENATAL VISIT NOTE  Subjective:  Lori Harris is a 29 y.o. G1P0 at [redacted]w[redacted]d being seen today for ongoing prenatal care.  She is currently monitored for the following issues for this high-risk pregnancy and has Encounter for supervision of normal pregnancy; Rubella non-immune status, antepartum; Obesity affecting pregnancy in second trimester; and Chronic hypertension affecting pregnancy on their problem list.  Patient reports no complaints.  Contractions: Not present. Vag. Bleeding: None.  Movement: Present. Denies leaking of fluid.   The following portions of the patient's history were reviewed and updated as appropriate: allergies, current medications, past family history, past medical history, past social history, past surgical history and problem list.   Objective:   Vitals:   03/30/20 1327  BP: 122/81  Pulse: (!) 113  Weight: 250 lb (113.4 kg)    Fetal Status: Fetal Heart Rate (bpm): 159   Movement: Present     General:  Alert, oriented and cooperative. Patient is in no acute distress.  Skin: Skin is warm and dry. No rash noted.   Cardiovascular: Normal heart rate noted  Respiratory: Normal respiratory effort, no problems with respiration noted  Abdomen: Soft, gravid, appropriate for gestational age.  Pain/Pressure: Absent     Pelvic: Cervical exam deferred        Extremities: Normal range of motion.  Edema: Trace  Mental Status: Normal mood and affect. Normal behavior. Normal judgment and thought content.   Assessment and Plan:  Pregnancy: G1P0 at [redacted]w[redacted]d  1. [redacted] weeks gestation of pregnancy  2. Encounter for supervision of normal pregnancy in third trimester, unspecified gravidity 94th%tile last visit F/u scheduled Flu vaccine today  3. Rubella non-immune status, antepartum MMR post partum  4. Obesity affecting pregnancy in second trimester  5. Chronic hypertension affecting pregnancy BP normal Cont baby ASA  Preterm labor symptoms and general obstetric  precautions including but not limited to vaginal bleeding, contractions, leaking of fluid and fetal movement were reviewed in detail with the patient. Please refer to After Visit Summary for other counseling recommendations.   Return in about 2 weeks (around 04/13/2020) for high OB, in person.  Future Appointments  Date Time Provider [place]  04/24/2020 11:00 AM [facility] NURSE [facility]  04/24/2020 11:15 AM [facility] US2 [facility]    Sloan Leiter, MD

## 2020-04-13 ENCOUNTER — Other Ambulatory Visit: Payer: Self-pay

## 2020-04-13 ENCOUNTER — Ambulatory Visit (INDEPENDENT_AMBULATORY_CARE_PROVIDER_SITE_OTHER): Payer: 59 | Admitting: Obstetrics & Gynecology

## 2020-04-13 VITALS — BP 135/87 | HR 97 | Wt 255.0 lb

## 2020-04-13 DIAGNOSIS — Z3493 Encounter for supervision of normal pregnancy, unspecified, third trimester: Secondary | ICD-10-CM

## 2020-04-13 DIAGNOSIS — O10919 Unspecified pre-existing hypertension complicating pregnancy, unspecified trimester: Secondary | ICD-10-CM

## 2020-04-13 DIAGNOSIS — O3660X0 Maternal care for excessive fetal growth, unspecified trimester, not applicable or unspecified: Secondary | ICD-10-CM

## 2020-04-13 DIAGNOSIS — Z3A33 33 weeks gestation of pregnancy: Secondary | ICD-10-CM

## 2020-04-13 LAB — POCT URINALYSIS DIPSTICK OB
Bilirubin, UA: NEGATIVE
Blood, UA: NEGATIVE
Glucose, UA: NEGATIVE
Ketones, UA: NEGATIVE
Nitrite, UA: NEGATIVE
POC,PROTEIN,UA: NEGATIVE
Spec Grav, UA: 1.02 (ref 1.010–1.025)
Urobilinogen, UA: 0.2 E.U./dL
pH, UA: 5 (ref 5.0–8.0)

## 2020-04-13 NOTE — Progress Notes (Signed)
   PRENATAL VISIT NOTE  Subjective:  Lori Harris is a 29 y.o. G1P0 at [redacted]w[redacted]d being seen today for ongoing prenatal care.  She is currently monitored for the following issues for this high-risk pregnancy and has Encounter for supervision of normal pregnancy; Rubella non-immune status, antepartum; Obesity affecting pregnancy in second trimester; Chronic hypertension affecting pregnancy; and Large for gestational age fetus affecting management of mother, antepartum on their problem list.  Patient reports fatigue.  Contractions: Not present. Vag. Bleeding: None.  Movement: Present. Denies leaking of fluid.   The following portions of the patient's history were reviewed and updated as appropriate: allergies, current medications, past family history, past medical history, past social history, past surgical history and problem list.   Objective:   Vitals:   04/13/20 1054  BP: 135/87  Pulse: 97  Weight: 255 lb (115.7 kg)    Fetal Status: Fetal Heart Rate (bpm): 154   Movement: Present     General:  Alert, oriented and cooperative. Patient is in no acute distress.  Skin: Skin is warm and dry. No rash noted.   Cardiovascular: Normal heart rate noted  Respiratory: Normal respiratory effort, no problems with respiration noted  Abdomen: Soft, gravid, appropriate for gestational age.  Pain/Pressure: Present     Pelvic: Cervical exam deferred        Extremities: Normal range of motion.  Edema: Trace  Mental Status: Normal mood and affect. Normal behavior. Normal judgment and thought content.   Assessment and Plan:  Pregnancy: G1P0 at [redacted]w[redacted]d 1. [redacted] weeks gestation of pregnancy - POC Urinalysis Dipstick OB - Culture, OB Urine  2. Chronic hypertension affecting pregnancy Pt is not on babyscripts.  She does not have a BP cuff.  Pt declined my offer to write a script for a BP cuff.  I advised her that monitoring her BP at home would be beneficial to picking up BP elevations.  I reviewed signs  and symptoms of Pre-E.  Pt denies all those symtpoms today.    3. Excessive fetal growth affecting management of pregnancy, antepartum, single or unspecified fetus MFM scans q4 weeks  Preterm labor symptoms and general obstetric precautions including but not limited to vaginal bleeding, contractions, leaking of fluid and fetal movement were reviewed in detail with the patient. Please refer to After Visit Summary for other counseling recommendations.   Return in about 2 weeks (around 04/27/2020) for Routine OB.  Future Appointments  Date Time Provider Department Center  04/24/2020 11:00 AM [facility] NURSE [facility]  04/24/2020 11:15 AM [facility] US2 [facility]  04/30/2020 10:45 AM Conan Bowens, MD [facility]    Elsie Lincoln, MD

## 2020-04-18 LAB — URINE CULTURE, OB REFLEX

## 2020-04-18 LAB — CULTURE, OB URINE

## 2020-04-24 ENCOUNTER — Ambulatory Visit: Payer: 59 | Attending: Obstetrics and Gynecology

## 2020-04-24 ENCOUNTER — Other Ambulatory Visit: Payer: Self-pay

## 2020-04-24 ENCOUNTER — Ambulatory Visit: Payer: 59 | Admitting: *Deleted

## 2020-04-24 DIAGNOSIS — O10919 Unspecified pre-existing hypertension complicating pregnancy, unspecified trimester: Secondary | ICD-10-CM | POA: Insufficient documentation

## 2020-04-24 DIAGNOSIS — O99891 Other specified diseases and conditions complicating pregnancy: Secondary | ICD-10-CM | POA: Diagnosis present

## 2020-04-24 DIAGNOSIS — O99212 Obesity complicating pregnancy, second trimester: Secondary | ICD-10-CM

## 2020-04-24 DIAGNOSIS — Z3493 Encounter for supervision of normal pregnancy, unspecified, third trimester: Secondary | ICD-10-CM

## 2020-04-24 DIAGNOSIS — O10913 Unspecified pre-existing hypertension complicating pregnancy, third trimester: Secondary | ICD-10-CM | POA: Diagnosis not present

## 2020-04-24 DIAGNOSIS — O09899 Supervision of other high risk pregnancies, unspecified trimester: Secondary | ICD-10-CM

## 2020-04-24 DIAGNOSIS — Z8279 Family history of other congenital malformations, deformations and chromosomal abnormalities: Secondary | ICD-10-CM

## 2020-04-24 DIAGNOSIS — Z283 Underimmunization status: Secondary | ICD-10-CM | POA: Insufficient documentation

## 2020-04-24 DIAGNOSIS — Z3A35 35 weeks gestation of pregnancy: Secondary | ICD-10-CM

## 2020-04-24 DIAGNOSIS — Z362 Encounter for other antenatal screening follow-up: Secondary | ICD-10-CM

## 2020-04-30 ENCOUNTER — Ambulatory Visit (INDEPENDENT_AMBULATORY_CARE_PROVIDER_SITE_OTHER): Payer: 59 | Admitting: Obstetrics and Gynecology

## 2020-04-30 ENCOUNTER — Other Ambulatory Visit (HOSPITAL_COMMUNITY)
Admission: RE | Admit: 2020-04-30 | Discharge: 2020-04-30 | Disposition: A | Discharge status: Home / Self Care | Payer: 59 | Source: Ambulatory Visit | Attending: Obstetrics and Gynecology | Admitting: Obstetrics and Gynecology

## 2020-04-30 ENCOUNTER — Encounter: Payer: Self-pay | Admitting: Obstetrics and Gynecology

## 2020-04-30 ENCOUNTER — Other Ambulatory Visit: Payer: Self-pay

## 2020-04-30 VITALS — BP 147/94 | HR 117 | Wt 260.0 lb

## 2020-04-30 DIAGNOSIS — Z3A36 36 weeks gestation of pregnancy: Secondary | ICD-10-CM | POA: Insufficient documentation

## 2020-04-30 DIAGNOSIS — Z283 Underimmunization status: Secondary | ICD-10-CM

## 2020-04-30 DIAGNOSIS — O10919 Unspecified pre-existing hypertension complicating pregnancy, unspecified trimester: Secondary | ICD-10-CM

## 2020-04-30 DIAGNOSIS — Z01419 Encounter for gynecological examination (general) (routine) without abnormal findings: Secondary | ICD-10-CM | POA: Diagnosis not present

## 2020-04-30 DIAGNOSIS — O09899 Supervision of other high risk pregnancies, unspecified trimester: Secondary | ICD-10-CM

## 2020-04-30 DIAGNOSIS — Z2839 Other underimmunization status: Secondary | ICD-10-CM

## 2020-04-30 DIAGNOSIS — Z3493 Encounter for supervision of normal pregnancy, unspecified, third trimester: Secondary | ICD-10-CM

## 2020-04-30 DIAGNOSIS — O99212 Obesity complicating pregnancy, second trimester: Secondary | ICD-10-CM

## 2020-04-30 DIAGNOSIS — O99891 Other specified diseases and conditions complicating pregnancy: Secondary | ICD-10-CM

## 2020-04-30 DIAGNOSIS — O3660X0 Maternal care for excessive fetal growth, unspecified trimester, not applicable or unspecified: Secondary | ICD-10-CM

## 2020-04-30 LAB — OB RESULTS CONSOLE GBS: GBS: NEGATIVE

## 2020-04-30 NOTE — Progress Notes (Signed)
   PRENATAL VISIT NOTE  Subjective:  Lori Harris is a 29 y.o. G1P0 at [redacted]w[redacted]d being seen today for ongoing prenatal care.  She is currently monitored for the following issues for this high-risk pregnancy and has Encounter for supervision of normal pregnancy; Rubella non-immune status, antepartum; Obesity affecting pregnancy in second trimester; Chronic hypertension affecting pregnancy; and Large for gestational age fetus affecting management of mother, antepartum on their problem list.  Patient reports no complaints.  Contractions: Irritability. Vag. Bleeding: None.  Movement: Present. Denies leaking of fluid.   The following portions of the patient's history were reviewed and updated as appropriate: allergies, current medications, past family history, past medical history, past social history, past surgical history and problem list.   Objective:   Vitals:   04/30/20 1036 04/30/20 1045  BP: (!) 157/92 (!) 147/94  Pulse: (!) 117   Weight: 260 lb (117.9 kg)     Fetal Status: Fetal Heart Rate (bpm): 154   Movement: Present     General:  Alert, oriented and cooperative. Patient is in no acute distress.  Skin: Skin is warm and dry. No rash noted.   Cardiovascular: Normal heart rate noted  Respiratory: Normal respiratory effort, no problems with respiration noted  Abdomen: Soft, gravid, appropriate for gestational age.  Pain/Pressure: Present     Pelvic: Cervical exam deferred        Extremities: Normal range of motion.  Edema: Trace  Mental Status: Normal mood and affect. Normal behavior. Normal judgment and thought content.   Assessment and Plan:  Pregnancy: G1P0 at [redacted]w[redacted]d  1. [redacted] weeks gestation of pregnancy - Culture, beta strep (group b only) - Cervicovaginal ancillary only( [place])  2. Chronic hypertension affecting pregnancy - Cont baby aspirin - Elevated today, will obtain labs - No meds - given BP creeping upwards, recommend induction 38-39 weeks, pt is agreeable  to this, induction scheduled for 38 weeks today  3. Encounter for supervision of normal pregnancy in third trimester, unspecified gravidity  4. Rubella non-immune status, antepartum MMR post partum  5. Obesity affecting pregnancy in second trimester  6. Excessive fetal growth affecting management of pregnancy, antepartum, single or unspecified fetus > 90th%tile on last growth  Preterm labor symptoms and general obstetric precautions including but not limited to vaginal bleeding, contractions, leaking of fluid and fetal movement were reviewed in detail with the patient. Please refer to After Visit Summary for other counseling recommendations.   Return in about 1 week (around 05/07/2020) for high OB, in person.  Future Appointments  Date Time Provider [place]  05/11/2020 10:45 AM Guss Bunde, MD [facility]    Sloan Leiter, MD

## 2020-05-01 LAB — COMPREHENSIVE METABOLIC PANEL
AG Ratio: 1.4 (calc) (ref 1.0–2.5)
ALT: 10 U/L (ref 6–29)
AST: 14 U/L (ref 10–30)
Albumin: 3.4 g/dL — ABNORMAL LOW (ref 3.6–5.1)
Alkaline phosphatase (APISO): 139 U/L — ABNORMAL HIGH (ref 31–125)
BUN: 7 mg/dL (ref 7–25)
CO2: 19 mmol/L — ABNORMAL LOW (ref 20–32)
Calcium: 9.1 mg/dL (ref 8.6–10.2)
Chloride: 105 mmol/L (ref 98–110)
Creat: 0.53 mg/dL (ref 0.50–1.10)
Globulin: 2.4 g/dL (calc) (ref 1.9–3.7)
Glucose, Bld: 128 mg/dL (ref 65–139)
Potassium: 4.1 mmol/L (ref 3.5–5.3)
Sodium: 135 mmol/L (ref 135–146)
Total Bilirubin: 0.3 mg/dL (ref 0.2–1.2)
Total Protein: 5.8 g/dL — ABNORMAL LOW (ref 6.1–8.1)

## 2020-05-01 LAB — CBC
HCT: 34.9 % — ABNORMAL LOW (ref 35.0–45.0)
Hemoglobin: 11.7 g/dL (ref 11.7–15.5)
MCH: 29.3 pg (ref 27.0–33.0)
MCHC: 33.5 g/dL (ref 32.0–36.0)
MCV: 87.5 fL (ref 80.0–100.0)
MPV: 11.7 fL (ref 7.5–12.5)
Platelets: 224 10*3/uL (ref 140–400)
RBC: 3.99 10*6/uL (ref 3.80–5.10)
RDW: 14.4 % (ref 11.0–15.0)
WBC: 9.7 10*3/uL (ref 3.8–10.8)

## 2020-05-01 LAB — CERVICOVAGINAL ANCILLARY ONLY
Chlamydia: NEGATIVE
Comment: NEGATIVE
Comment: NORMAL
Neisseria Gonorrhea: NEGATIVE

## 2020-05-01 LAB — PROTEIN / CREATININE RATIO, URINE
Creatinine, Urine: 81 mg/dL (ref 20–275)
Protein/Creat Ratio: 99 mg/g creat (ref 21–161)
Protein/Creatinine Ratio: 0.099 mg/mg creat (ref 0.021–0.16)
Total Protein, Urine: 8 mg/dL (ref 5–24)

## 2020-05-03 LAB — CULTURE, BETA STREP (GROUP B ONLY)
MICRO NUMBER:: 11019066
SPECIMEN QUALITY:: ADEQUATE

## 2020-05-05 ENCOUNTER — Other Ambulatory Visit: Payer: Self-pay | Admitting: Advanced Practice Midwife

## 2020-05-05 ENCOUNTER — Encounter (HOSPITAL_COMMUNITY): Payer: Self-pay | Admitting: *Deleted

## 2020-05-05 ENCOUNTER — Telehealth (HOSPITAL_COMMUNITY): Payer: Self-pay | Admitting: *Deleted

## 2020-05-05 NOTE — Telephone Encounter (Signed)
Preadmission screen  

## 2020-05-11 ENCOUNTER — Other Ambulatory Visit: Payer: Self-pay

## 2020-05-11 ENCOUNTER — Other Ambulatory Visit (HOSPITAL_COMMUNITY)
Admission: RE | Admit: 2020-05-11 | Discharge: 2020-05-11 | Disposition: A | Discharge status: Home / Self Care | Payer: 59 | Source: Ambulatory Visit | Attending: Family Medicine | Admitting: Family Medicine

## 2020-05-11 ENCOUNTER — Encounter (HOSPITAL_COMMUNITY): Payer: Self-pay | Admitting: Obstetrics and Gynecology

## 2020-05-11 ENCOUNTER — Ambulatory Visit (INDEPENDENT_AMBULATORY_CARE_PROVIDER_SITE_OTHER): Payer: 59 | Admitting: Obstetrics & Gynecology

## 2020-05-11 ENCOUNTER — Inpatient Hospital Stay (HOSPITAL_COMMUNITY)
Admission: AD | Admit: 2020-05-11 | Discharge: 2020-05-15 | DRG: 807 | Disposition: A | Discharge status: Home / Self Care | Payer: 59 | Attending: Obstetrics and Gynecology | Admitting: Obstetrics and Gynecology

## 2020-05-11 VITALS — BP 149/93 | HR 80 | Wt 261.0 lb

## 2020-05-11 DIAGNOSIS — J45909 Unspecified asthma, uncomplicated: Secondary | ICD-10-CM | POA: Diagnosis present

## 2020-05-11 DIAGNOSIS — Z20822 Contact with and (suspected) exposure to covid-19: Secondary | ICD-10-CM | POA: Insufficient documentation

## 2020-05-11 DIAGNOSIS — Z283 Underimmunization status: Secondary | ICD-10-CM

## 2020-05-11 DIAGNOSIS — Z6841 Body Mass Index (BMI) 40.0 and over, adult: Secondary | ICD-10-CM

## 2020-05-11 DIAGNOSIS — Z3493 Encounter for supervision of normal pregnancy, unspecified, third trimester: Secondary | ICD-10-CM

## 2020-05-11 DIAGNOSIS — O10919 Unspecified pre-existing hypertension complicating pregnancy, unspecified trimester: Secondary | ICD-10-CM

## 2020-05-11 DIAGNOSIS — O1410 Severe pre-eclampsia, unspecified trimester: Secondary | ICD-10-CM

## 2020-05-11 DIAGNOSIS — Z2839 Other underimmunization status: Secondary | ICD-10-CM

## 2020-05-11 DIAGNOSIS — Z23 Encounter for immunization: Secondary | ICD-10-CM | POA: Diagnosis not present

## 2020-05-11 DIAGNOSIS — O3663X0 Maternal care for excessive fetal growth, third trimester, not applicable or unspecified: Secondary | ICD-10-CM | POA: Diagnosis present

## 2020-05-11 DIAGNOSIS — O114 Pre-existing hypertension with pre-eclampsia, complicating childbirth: Principal | ICD-10-CM | POA: Diagnosis present

## 2020-05-11 DIAGNOSIS — O99212 Obesity complicating pregnancy, second trimester: Secondary | ICD-10-CM | POA: Diagnosis present

## 2020-05-11 DIAGNOSIS — O1414 Severe pre-eclampsia complicating childbirth: Secondary | ICD-10-CM | POA: Diagnosis not present

## 2020-05-11 DIAGNOSIS — O9952 Diseases of the respiratory system complicating childbirth: Secondary | ICD-10-CM | POA: Diagnosis present

## 2020-05-11 DIAGNOSIS — O09899 Supervision of other high risk pregnancies, unspecified trimester: Secondary | ICD-10-CM

## 2020-05-11 DIAGNOSIS — O99214 Obesity complicating childbirth: Secondary | ICD-10-CM | POA: Diagnosis present

## 2020-05-11 DIAGNOSIS — Z01812 Encounter for preprocedural laboratory examination: Secondary | ICD-10-CM | POA: Insufficient documentation

## 2020-05-11 DIAGNOSIS — Z3A37 37 weeks gestation of pregnancy: Secondary | ICD-10-CM

## 2020-05-11 DIAGNOSIS — I10 Essential (primary) hypertension: Secondary | ICD-10-CM | POA: Diagnosis present

## 2020-05-11 DIAGNOSIS — O1002 Pre-existing essential hypertension complicating childbirth: Secondary | ICD-10-CM | POA: Diagnosis present

## 2020-05-11 DIAGNOSIS — Z79899 Other long term (current) drug therapy: Secondary | ICD-10-CM | POA: Diagnosis not present

## 2020-05-11 DIAGNOSIS — O99891 Other specified diseases and conditions complicating pregnancy: Secondary | ICD-10-CM

## 2020-05-11 DIAGNOSIS — O119 Pre-existing hypertension with pre-eclampsia, unspecified trimester: Secondary | ICD-10-CM

## 2020-05-11 LAB — COMPREHENSIVE METABOLIC PANEL
ALT: 16 U/L (ref 0–44)
AST: 22 U/L (ref 15–41)
Albumin: 2.9 g/dL — ABNORMAL LOW (ref 3.5–5.0)
Alkaline Phosphatase: 132 U/L — ABNORMAL HIGH (ref 38–126)
Anion gap: 11 (ref 5–15)
BUN: 7 mg/dL (ref 6–20)
CO2: 21 mmol/L — ABNORMAL LOW (ref 22–32)
Calcium: 9.5 mg/dL (ref 8.9–10.3)
Chloride: 104 mmol/L (ref 98–111)
Creatinine, Ser: 0.7 mg/dL (ref 0.44–1.00)
GFR, Estimated: >60 mL/min (ref >60)
Glucose, Bld: 72 mg/dL (ref 70–99)
Potassium: 4.2 mmol/L (ref 3.5–5.1)
Sodium: 136 mmol/L (ref 135–145)
Total Bilirubin: 0.4 mg/dL (ref 0.3–1.2)
Total Protein: 6.5 g/dL (ref 6.5–8.1)

## 2020-05-11 LAB — CBC
HCT: 37.5 % (ref 36.0–46.0)
Hemoglobin: 12 g/dL (ref 12.0–15.0)
MCH: 28.6 pg (ref 26.0–34.0)
MCHC: 32 g/dL (ref 30.0–36.0)
MCV: 89.5 fL (ref 80.0–100.0)
Platelets: 208 10*3/uL (ref 150–400)
RBC: 4.19 MIL/uL (ref 3.87–5.11)
RDW: 15.2 % (ref 11.5–15.5)
WBC: 9.9 10*3/uL (ref 4.0–10.5)
nRBC: 0 % (ref 0.0–0.2)

## 2020-05-11 LAB — TYPE AND SCREEN
ABO/RH(D): A POS
Antibody Screen: NEGATIVE

## 2020-05-11 LAB — PROTEIN / CREATININE RATIO, URINE
Creatinine, Urine: 114.61 mg/dL
Protein Creatinine Ratio: 0.18 mg/mg{Cre} — ABNORMAL HIGH (ref 0.00–0.15)
Total Protein, Urine: 21 mg/dL

## 2020-05-11 LAB — SARS CORONAVIRUS 2 (TAT 6-24 HRS): SARS Coronavirus 2: NEGATIVE

## 2020-05-11 MED ORDER — ACETAMINOPHEN 325 MG PO TABS
650.0000 mg | ORAL_TABLET | ORAL | Status: DC | PRN
  Administered 2020-05-12 (×3): 650 mg via ORAL
  Filled 2020-05-11 (×3): qty 2

## 2020-05-11 MED ORDER — HYDRALAZINE HCL 20 MG/ML IJ SOLN: 10.0000 mg | INTRAMUSCULAR | Status: DC | PRN

## 2020-05-11 MED ORDER — LACTATED RINGERS IV SOLN: INTRAVENOUS | Status: DC

## 2020-05-11 MED ORDER — OXYCODONE-ACETAMINOPHEN 5-325 MG PO TABS: 2.0000 | ORAL_TABLET | ORAL | Status: DC | PRN

## 2020-05-11 MED ORDER — HYDRALAZINE HCL 20 MG/ML IJ SOLN
10.0000 mg | INTRAMUSCULAR | Status: DC | PRN
  Administered 2020-05-12: 10 mg via INTRAVENOUS
  Filled 2020-05-11: qty 1

## 2020-05-11 MED ORDER — TERBUTALINE SULFATE 1 MG/ML IJ SOLN: 0.2500 mg | Freq: Once | INTRAMUSCULAR | Status: DC | PRN

## 2020-05-11 MED ORDER — LABETALOL HCL 5 MG/ML IV SOLN: 80.0000 mg | INTRAVENOUS | Status: DC | PRN

## 2020-05-11 MED ORDER — OXYTOCIN-SODIUM CHLORIDE 30-0.9 UT/500ML-% IV SOLN
1.0000 m[IU]/min | INTRAVENOUS | Status: DC
  Administered 2020-05-12: 2 m[IU]/min via INTRAVENOUS
  Filled 2020-05-11: qty 500

## 2020-05-11 MED ORDER — MAGNESIUM SULFATE 40 GM/1000ML IV SOLN
2.0000 g/h | INTRAVENOUS | Status: DC
  Administered 2020-05-12: 2 g/h via INTRAVENOUS
  Filled 2020-05-11 (×2): qty 1000

## 2020-05-11 MED ORDER — MISOPROSTOL 25 MCG QUARTER TABLET
25.0000 ug | ORAL_TABLET | ORAL | Status: DC | PRN
  Administered 2020-05-11 (×2): 25 ug via VAGINAL
  Filled 2020-05-11 (×2): qty 1

## 2020-05-11 MED ORDER — SOD CITRATE-CITRIC ACID 500-334 MG/5ML PO SOLN: 30.0000 mL | ORAL | Status: DC | PRN

## 2020-05-11 MED ORDER — OXYCODONE-ACETAMINOPHEN 5-325 MG PO TABS: 1.0000 | ORAL_TABLET | ORAL | Status: DC | PRN

## 2020-05-11 MED ORDER — ONDANSETRON HCL 4 MG/2ML IJ SOLN
4.0000 mg | Freq: Four times a day (QID) | INTRAMUSCULAR | Status: DC | PRN
  Administered 2020-05-12: 4 mg via INTRAVENOUS
  Filled 2020-05-11: qty 2

## 2020-05-11 MED ORDER — LABETALOL HCL 5 MG/ML IV SOLN: 20.0000 mg | INTRAVENOUS | Status: DC | PRN

## 2020-05-11 MED ORDER — OXYTOCIN BOLUS FROM INFUSION
333.0000 mL | Freq: Once | INTRAVENOUS | Status: AC
  Administered 2020-05-12: 333 mL via INTRAVENOUS

## 2020-05-11 MED ORDER — MAGNESIUM SULFATE BOLUS VIA INFUSION
4.0000 g | Freq: Once | INTRAVENOUS | Status: AC
  Administered 2020-05-12: 4 g via INTRAVENOUS
  Filled 2020-05-11: qty 1000

## 2020-05-11 MED ORDER — LABETALOL HCL 5 MG/ML IV SOLN: 40.0000 mg | INTRAVENOUS | Status: DC | PRN

## 2020-05-11 MED ORDER — LABETALOL HCL 5 MG/ML IV SOLN
20.0000 mg | INTRAVENOUS | Status: DC | PRN
  Administered 2020-05-11 (×2): 20 mg via INTRAVENOUS
  Filled 2020-05-11 (×3): qty 4

## 2020-05-11 MED ORDER — OXYTOCIN-SODIUM CHLORIDE 30-0.9 UT/500ML-% IV SOLN
2.5000 [IU]/h | INTRAVENOUS | Status: DC
  Filled 2020-05-11: qty 500

## 2020-05-11 MED ORDER — LIDOCAINE HCL (PF) 1 % IJ SOLN: 30.0000 mL | INTRAMUSCULAR | Status: DC | PRN

## 2020-05-11 MED ORDER — LABETALOL HCL 5 MG/ML IV SOLN
40.0000 mg | INTRAVENOUS | Status: DC | PRN
  Administered 2020-05-12: 40 mg via INTRAVENOUS
  Filled 2020-05-11: qty 8

## 2020-05-11 MED ORDER — LABETALOL HCL 5 MG/ML IV SOLN
80.0000 mg | INTRAVENOUS | Status: DC | PRN
  Administered 2020-05-12: 80 mg via INTRAVENOUS
  Filled 2020-05-11: qty 16

## 2020-05-11 MED ORDER — LACTATED RINGERS IV SOLN: 500.0000 mL | INTRAVENOUS | Status: DC | PRN

## 2020-05-11 NOTE — Progress Notes (Signed)
Pt denies headache or blurred vision    PRENATAL VISIT NOTE  Subjective:  Lori Harris is a 29 y.o. G1P0 at [redacted]w[redacted]d being seen today for ongoing prenatal care.  She is currently monitored for the following issues for this high-risk pregnancy and has Encounter for supervision of normal pregnancy; Rubella non-immune status, antepartum; Obesity affecting pregnancy in second trimester; Chronic hypertension affecting pregnancy; and Large for gestational age fetus affecting management of mother, antepartum on their problem list.  Patient reports no complaints.  Contractions: Irritability. Vag. Bleeding: None.  Movement: Present. Denies leaking of fluid.   The following portions of the patient's history were reviewed and updated as appropriate: allergies, current medications, past family history, past medical history, past social history, past surgical history and problem list.   Objective:   Vitals:   05/11/20 1032 05/11/20 1136  BP: (!) 151/90 (!) 149/93  Pulse: 80   Weight: 261 lb (118.4 kg)     Fetal Status: Fetal Heart Rate (bpm): 147 Fundal Height: 39 cm Movement: Present     General:  Alert, oriented and cooperative. Patient is in no acute distress.  Skin: Skin is warm and dry. No rash noted.   Cardiovascular: Normal heart rate noted  Respiratory: Normal respiratory effort, no problems with respiration noted  Abdomen: Soft, gravid, appropriate for gestational age.  Pain/Pressure: Present     Pelvic: Cervical exam deferred        Extremities: Normal range of motion.  Edema: Trace  Mental Status: Normal mood and affect. Normal behavior. Normal judgment and thought content.   Baseline: 150 Accelerations: present Decelerations:  absent Variability: moderate Interpretation:  Reactive NST  Assessment and Plan:  Pregnancy: G1P0 at [redacted]w[redacted]d 1. [redacted] weeks gestation of pregnancy Pt has had elevated BPs for several weeks.  New onset proteinuria (1+).  NST reactive.  Plan for induction  today.  L & D and Dr. Ashok Pall called for planning. - POC Urinalysis Dipstick OB  Term labor symptoms and general obstetric precautions including but not limited to vaginal bleeding, contractions, leaking of fluid and fetal movement were reviewed in detail with the patient. Please refer to After Visit Summary for other counseling recommendations.   No follow-ups on file.  Future Appointments  Date Time Provider Department Center  05/13/2020  7:55 AM [facility] SCHED ROOM [facility] None    Elsie Lincoln, MD

## 2020-05-11 NOTE — H&P (Addendum)
OBSTETRIC ADMISSION HISTORY AND PHYSICAL  Lori Harris is a 29 y.o. female G1P0 with IUP at [redacted]w[redacted]d by 9 wk [place] presenting for IOL for worsening cHTN. She reports +FMs, No LOF, no VB, no blurry vision, headaches or peripheral edema, and RUQ pain.  She plans on breast feeding. She is undecided for birth control. She received her prenatal care at [facility]   Dating: By Caleen Essex --->  Estimated Date of Delivery: 05/27/20  Sono:   @[redacted]w[redacted]d , normal anatomy, cephalic presentation, 3170g, EFW  Prenatal History/Complications:  CHTN - not on meds. On asa 81mg . Most recent labs stable, UPC .099   LGA- 97%ile, see 67% above BMI 51 Rubella non immune   Past Medical History: Past Medical History:  Diagnosis Date  . Asthma     Past Surgical History: Past Surgical History:  Procedure Laterality Date  . ADENOIDECTOMY    . TONSILLECTOMY      Obstetrical History: OB History    Gravida  1   Para      Term      Preterm      AB      Living  0     SAB      TAB      Ectopic      Multiple      Live Births              Social History Social History   Socioeconomic History  . Marital status: Married    Spouse name: Not on file  . Number of children: Not on file  . Years of education: Not on file  . Highest education level: Not on file  Occupational History  . Occupation: Retail  Tobacco Use  . Smoking status: Never Smoker  . Smokeless tobacco: Never Used  Vaping Use  . Vaping Use: Never used  Substance and Sexual Activity  . Alcohol use: Not Currently  . Drug use: Never  . Sexual activity: Yes    Birth control/protection: None  Other Topics Concern  . Not on file  Social History Narrative  . Not on file   Social Determinants of Health   Financial Resource Strain:   . Difficulty of Paying Living Expenses: Not on file  Food Insecurity:   . Worried About in the Last Year: Not on file  . Ran Out of Food in the Last Year: Not on  file  Transportation Needs:   . Lack of Transportation (Medical): Not on file  . Lack of Transportation (Non-Medical): Not on file  Physical Activity:   . Days of Exercise per Week: Not on file  . Minutes of Exercise per Session: Not on file  Stress:   . Feeling of Stress : Not on file  Social Connections:   . Frequency of Communication with Friends and Family: Not on file  . Frequency of Social Gatherings with Friends and Family: Not on file  . Attends Religious Services: Not on file  . Active Member of Clubs or Organizations: Not on file  . Attends [place] Meetings: Not on file  . Marital Status: Not on file    Family History: Family History  Problem Relation Age of Onset  . Diabetes Mother   . Heart disease Mother   . Hypothyroidism Mother   . Diabetes Maternal Grandmother   . Hypothyroidism Maternal Grandmother   . Spina bifida Maternal Grandfather   . Hypothyroidism Maternal Aunt   . Hypothyroidism Maternal Aunt  Allergies: No Known Allergies  Medications Prior to Admission  Medication Sig Dispense Refill Last Dose  . albuterol (VENTOLIN HFA) 108 (90 Base) MCG/ACT inhaler Inhale 1-2 puffs into the lungs every 6 (six) hours as needed for wheezing or shortness of breath. (Patient not taking: Reported on 04/30/2020) 18 g 3   . aspirin EC 81 MG tablet Take 1 tablet (81 mg total) by mouth daily. 30 tablet 5   . LORATADINE PO Take by mouth.     . Misc. Devices (BREAST PUMP) MISC 1 Device by Does not apply route as needed. (Patient not taking: Reported on 04/30/2020) 1 each 0   . Prenatal Vit-Fe Fumarate-FA (PRENATAL VITAMINS PO) Take by mouth. (Patient not taking: Reported on 05/11/2020)        Review of Systems   All systems reviewed and negative except as stated in HPI  Last menstrual period 08/05/2019. General appearance: alert, cooperative and appears stated age Lungs: clear to auscultation bilaterally Heart: regular rate and rhythm Abdomen: soft,  non-tender; bowel sounds normal Extremities: Homans sign is negative, no sign of DVT DTR's: 2+  Presentation: cephalic by US  Fetal monitoringBaseline: 145 bpm, moderate variability, pos accel, neg decels  Uterine activity irritable      Prenatal labs: ABO, Rh: A/RH(D) POSITIVE/-- (04/07 1030) Antibody: NO ANTIBODIES DETECTED (04/07 1030) Rubella: <0.90 (04/07 1030) RPR: NON-REACTIVE (08/04 0820)  HBsAg: NON-REACTIVE (04/07 1030)  HIV: NON-REACTIVE (08/04 0820)  GBS:    2 hr Glucola negative Genetic screening  normal Anatomy US normal  Prenatal Transfer Tool  Maternal Diabetes: No Genetic Screening: Normal Maternal Ultrasounds/Referrals: Normal Fetal Ultrasounds or other Referrals:  None Maternal Substance Abuse:  No Significant Maternal Medications:  None Significant Maternal Lab Results: Group B Strep negative  No results found for this or any previous visit (from the past 24 hour(s)).  Patient Active Problem List   Diagnosis Date Noted  . Large for gestational age fetus affecting management of mother, antepartum 04/13/2020  . Chronic hypertension affecting pregnancy 02/11/2020  . Obesity affecting pregnancy in second trimester 12/17/2019  . Rubella non-immune status, antepartum 11/17/2019  . Encounter for supervision of normal pregnancy 10/23/2019    Assessment/Plan:  Lori Harris is a 29 y.o. G1P0 at [redacted]w[redacted]d here for IOL for cHTN in setting of acutely worsening pressures, concern for evolving PEC w/o SF.  #Induction of Labor: Initial SVE: fingertip/thick/-3. Cephalic by [place]. Will start with cytotec and recheck in 4hours, attempt FB at that time.   #cHTN, now with worsening BP and increasing proteinuria: repeat PEC labs stable otherwise. Continue to monitor BP closely.   #LGA: 37wk [place] extrapolates to 3825g. Leopold's 8.5lbs.     #Pain: Epidural, pain meds prn  #FWB: Cat I  #ID:  GBS neg  #MOF: Breast #MOC:undecided  #Circ:  Yes    05/11/2020, 2:59  PM Casper Harrison, MD OB Family Medicine Fellow, [facility], [facility]

## 2020-05-11 NOTE — Progress Notes (Signed)
Labor Progress Note Lori Harris is a 29 y.o. G1P0 at [redacted]w[redacted]d presented for IOL secondary to worsening cHTN.  S: Pt reports slight abdominal tightening with contractions. No headache, vision changes, or other concerns at this time.   O:  BP (!) 157/81   Pulse 67   Temp 98.5 F (36.9 C) (Oral)   Resp 18   Ht 5' (1.524 m)   Wt 118.7 kg   LMP 08/05/2019   BMI 51.11 kg/m  EFM: baseline 135/moderate variability/+accels/no decels  CVE: Dilation: 1 Effacement (%): Thick Station: -3 Presentation: Vertex Exam by:: Jacqulyn Liner, RN   A&P: 29 y.o. G1P0 [redacted]w[redacted]d presenting for IOL secondary to worsening cHTN. #Labor: Progressing well s/p 2 doses of cytotec (last dose at 2016). FB placed with speculum at 2150. Will plan to re-evaluate in 4 hours s/p last dose of cytotec. #Pain: pt plans for epidural #FWB: Category 1 strip #GBS negative #cHTN: no current medications. Pt did require 1 dose of IV labetalol on arrival to L&D but since has had all mild range blood pressures. If pt develops any additional severe range blood pressures will plan to initiate magnesium for seizure ppx. CBC and CMP wnl on admission. UP:C result pending.  Sheila Oats, MD 10:40 PM

## 2020-05-12 ENCOUNTER — Inpatient Hospital Stay (HOSPITAL_COMMUNITY): Payer: 59 | Admitting: Anesthesiology

## 2020-05-12 LAB — CBC
HCT: 35 % — ABNORMAL LOW (ref 36.0–46.0)
HCT: 38 % (ref 36.0–46.0)
Hemoglobin: 11.4 g/dL — ABNORMAL LOW (ref 12.0–15.0)
Hemoglobin: 12 g/dL (ref 12.0–15.0)
MCH: 28.6 pg (ref 26.0–34.0)
MCH: 28.4 pg (ref 26.0–34.0)
MCHC: 32.6 g/dL (ref 30.0–36.0)
MCHC: 31.6 g/dL (ref 30.0–36.0)
MCV: 87.7 fL (ref 80.0–100.0)
MCV: 90 fL (ref 80.0–100.0)
Platelets: 211 10*3/uL (ref 150–400)
Platelets: 258 10*3/uL (ref 150–400)
RBC: 3.99 MIL/uL (ref 3.87–5.11)
RBC: 4.22 MIL/uL (ref 3.87–5.11)
RDW: 15.2 % (ref 11.5–15.5)
RDW: 15.6 % — ABNORMAL HIGH (ref 11.5–15.5)
WBC: 13.6 10*3/uL — ABNORMAL HIGH (ref 4.0–10.5)
WBC: 21.8 10*3/uL — ABNORMAL HIGH (ref 4.0–10.5)
nRBC: 0 % (ref 0.0–0.2)
nRBC: 0 % (ref 0.0–0.2)

## 2020-05-12 LAB — RPR: RPR Ser Ql: NONREACTIVE

## 2020-05-12 MED ORDER — BUTALBITAL-APAP-CAFFEINE 50-325-40 MG PO TABS
1.0000 | ORAL_TABLET | Freq: Once | ORAL | Status: AC
  Administered 2020-05-12: 1 via ORAL
  Filled 2020-05-12: qty 1

## 2020-05-12 MED ORDER — EPHEDRINE 5 MG/ML INJ: 10.0000 mg | INTRAVENOUS | Status: DC | PRN

## 2020-05-12 MED ORDER — DIPHENHYDRAMINE HCL 50 MG/ML IJ SOLN: 12.5000 mg | INTRAMUSCULAR | Status: DC | PRN

## 2020-05-12 MED ORDER — SODIUM CHLORIDE (PF) 0.9 % IJ SOLN
INTRAMUSCULAR | Status: DC | PRN
Start: 2020-05-12 — End: 2020-05-12
  Administered 2020-05-12: 12 mL/h via EPIDURAL

## 2020-05-12 MED ORDER — LACTATED RINGERS IV SOLN: 500.0000 mL | Freq: Once | INTRAVENOUS | Status: DC

## 2020-05-12 MED ORDER — PHENYLEPHRINE 40 MCG/ML (10ML) SYRINGE FOR IV PUSH (FOR BLOOD PRESSURE SUPPORT): 80.0000 ug | PREFILLED_SYRINGE | INTRAVENOUS | Status: DC | PRN

## 2020-05-12 MED ORDER — LACTATED RINGERS IV SOLN
500.0000 mL | Freq: Once | INTRAVENOUS | Status: AC
  Administered 2020-05-12: 500 mL via INTRAVENOUS

## 2020-05-12 MED ORDER — FENTANYL-BUPIVACAINE-NACL 0.5-0.125-0.9 MG/250ML-% EP SOLN
12.0000 mL/h | EPIDURAL | Status: DC | PRN
  Filled 2020-05-12: qty 250

## 2020-05-12 MED ORDER — ONDANSETRON HCL 4 MG/2ML IJ SOLN
4.0000 mg | INTRAMUSCULAR | Status: DC | PRN
  Administered 2020-05-12: 4 mg via INTRAVENOUS
  Filled 2020-05-12: qty 2

## 2020-05-12 MED ORDER — PHENYLEPHRINE 40 MCG/ML (10ML) SYRINGE FOR IV PUSH (FOR BLOOD PRESSURE SUPPORT)
80.0000 ug | PREFILLED_SYRINGE | INTRAVENOUS | Status: DC | PRN
  Filled 2020-05-12: qty 10

## 2020-05-12 MED ORDER — MISOPROSTOL 200 MCG PO TABS
ORAL_TABLET | ORAL | Status: AC
  Filled 2020-05-12: qty 5

## 2020-05-12 MED ORDER — LIDOCAINE HCL (PF) 1 % IJ SOLN
INTRAMUSCULAR | Status: DC | PRN
  Administered 2020-05-12: 6 mL via EPIDURAL

## 2020-05-12 MED ORDER — FENTANYL CITRATE (PF) 100 MCG/2ML IJ SOLN
100.0000 ug | INTRAMUSCULAR | Status: DC | PRN
  Administered 2020-05-12 (×3): 100 ug via INTRAVENOUS
  Filled 2020-05-12 (×3): qty 2

## 2020-05-12 MED ORDER — FENTANYL-BUPIVACAINE-NACL 0.5-0.125-0.9 MG/250ML-% EP SOLN: 12.0000 mL/h | EPIDURAL | Status: DC | PRN

## 2020-05-12 MED ORDER — MISOPROSTOL 200 MCG PO TABS
1000.0000 ug | ORAL_TABLET | Freq: Once | ORAL | Status: AC
  Administered 2020-05-12: 1000 ug via RECTAL

## 2020-05-12 MED ORDER — PHENYLEPHRINE 40 MCG/ML (10ML) SYRINGE FOR IV PUSH (FOR BLOOD PRESSURE SUPPORT)
80.0000 ug | PREFILLED_SYRINGE | INTRAVENOUS | Status: DC | PRN
  Administered 2020-05-12: 80 ug via INTRAVENOUS

## 2020-05-12 NOTE — Anesthesia Preprocedure Evaluation (Signed)
Anesthesia Evaluation  Patient identified by MRN, date of birth, ID band Patient awake    Reviewed: Allergy & Precautions, H&P , NPO status , Patient's Chart, lab work & pertinent test results, reviewed documented beta blocker date and time   Airway Mallampati: III  TM Distance: >3 FB Neck ROM: full    Dental no notable dental hx. (+) Teeth Intact, Dental Advisory Given   Pulmonary asthma ,    Pulmonary exam normal breath sounds clear to auscultation       Cardiovascular hypertension, Pt. on medications and Pt. on home beta blockers Normal cardiovascular exam Rhythm:regular Rate:Normal     Neuro/Psych negative neurological ROS  negative psych ROS   GI/Hepatic negative GI ROS, Neg liver ROS,   Endo/Other  Morbid obesity  Renal/GU negative Renal ROS  negative genitourinary   Musculoskeletal   Abdominal (+) + obese,   Peds  Hematology negative hematology ROS (+)   Anesthesia Other Findings   Reproductive/Obstetrics (+) Pregnancy                             Anesthesia Physical Anesthesia Plan  ASA: III  Anesthesia Plan: Epidural   Post-op Pain Management:    Induction:   PONV Risk Score and Plan: 2  Airway Management Planned: Natural Airway  Additional Equipment: None  Intra-op Plan:   Post-operative Plan:   Informed Consent: I have reviewed the patients History and Physical, chart, labs and discussed the procedure including the risks, benefits and alternatives for the proposed anesthesia with the patient or authorized representative who has indicated his/her understanding and acceptance.     Dental Advisory Given  Plan Discussed with: Anesthesiologist and CRNA  Anesthesia Plan Comments: (Labs checked- platelets confirmed with RN in room. Fetal heart tracing, per RN, reported to be stable enough for sitting procedure. Discussed epidural, and patient consents to the  procedure:  included risk of possible headache,backache, failed block, allergic reaction, and nerve injury. This patient was asked if she had any questions or concerns before the procedure started.)        Anesthesia Quick Evaluation

## 2020-05-12 NOTE — Progress Notes (Signed)
Labor Progress Note Lori Harris is a 29 y.o. G1P0 at 52w5dpresented for IOL secondary to worsening cHTN, now with SIPE with severe features based on blood pressures.  S: Doing well, pain relief with epidural, reports she can't feel contractions.  O:  BP 122/71   Pulse 67   Temp 98.2 F (36.8 C) (Oral)   Resp 16   Ht 5' (1.524 m)   Wt 118.7 kg   LMP 08/05/2019   SpO2 98%   BMI 51.11 kg/m  EFM: baseline 130/moderate variability/+accels/no decels Toco: q2-3 minutes  CVE: Dilation: 7 Effacement (%): 80 Cervical Position: Middle Station: -1 Presentation: Vertex Exam by:: HAmerican Standard CompaniesRN  A&P: 29y.o. [id number] for IOL secondary to worsening cHTN, now with SIPE with severe features based on blood pressures.  #Labor: S/p cytotec x1/FB. Made good cervical change, IUPC placed with adequate contractions. Currently on pitocin. #Pain: CSE #FWB: Category 1 #GBS negative #cHTN with SIPE with SF: Pressures normal range. PreE labs nml. On Mg. Anti-hypertensive protocol ordered. Headache improved with Fiorcet. #Rubella Non-immune: plan for MMR in postpartum period #LGA: SD precautions during delivery  AmTilden DomeMD PGY1 5:23 PM

## 2020-05-12 NOTE — Anesthesia Procedure Notes (Signed)
Epidural Patient location during procedure: OB Start time: 05/12/2020 9:03 AM End time: 05/12/2020 9:07 AM  Staffing Anesthesiologist: Adasyn Mcadams, MD  Preanesthetic Checklist Completed: patient identified, IV checked, site marked, risks and benefits discussed, surgical consent, monitors and equipment checked, pre-op evaluation and timeout performed  Epidural Patient position: sitting Prep: DuraPrep and site prepped and draped Patient monitoring: continuous pulse ox and blood pressure Approach: midline Location: L3-L4 Injection technique: LOR air  Needle:  Needle type: Tuohy  Needle gauge: 17 G Needle length: 9 cm and 9 Needle insertion depth: 9 cm Catheter type: closed end flexible Catheter size: 19 Gauge Catheter at skin depth: 15 cm Test dose: negative  Assessment Events: blood not aspirated, injection not painful, no injection resistance, no paresthesia and negative IV test     

## 2020-05-12 NOTE — Progress Notes (Signed)
Labor Progress Note Margretta Zamorano is a 29 y.o. G1P0 at 74w5dpresented for IOL secondary to worsening cHTN, now with SIPE with severe features based on blood pressures.  S: Doing well without complaints.  O:  BP (!) 150/73   Pulse 76   Temp 98.7 F (37.1 C) (Oral)   Resp 17   Ht 5' (1.524 m)   Wt 118.7 kg   LMP 08/05/2019   SpO2 99%   BMI 51.11 kg/m  EFM: baseline 140/moderate variability/+accels/no decels Toco: q1-2 minutes  CVE: Dilation: 5 Effacement (%): 50 Station: -1 Presentation: Vertex Exam by:: [place] MD   A&P: 29y.o. G1P0 355w5dresenting for IOL secondary to worsening cHTN, now with SIPE with severe features based on blood pressures.  #Labor: S/p cytotec x1/FB. Currently on pitocin, continue to titrate. #Pain: pt plans for epidural #FWB: Category 1 #GBS negative #cHTN with SIPE with SF: preE labs nml. On Mg. Anti-hypertensive protocol ordered. Currently asymptomatic. #Rubella Non-immune: plan for MMR in postpartum period #LGA: SD precautions during delivery  AlArrie SenateMD 9:03 AM

## 2020-05-12 NOTE — Progress Notes (Signed)
Labor Progress Note Lori Harris is a 29 y.o. G1P0 at 49w5dpresented for IOL secondary to worsening cHTN, now with SIPE with severe features based on blood pressures.  S: Pt reports mild headache without vision changes. Discussed plan to start magnesium as noted below and pt endorses understanding of the plan.  O:  BP (!) 165/85   Pulse 73   Temp 98.1 F (36.7 C) (Oral)   Resp 18   Ht 5' (1.524 m)   Wt 118.7 kg   LMP 08/05/2019   BMI 51.11 kg/m  EFM: baseline 130/moderate variability/+accels/no decels  CVE: Dilation: 1 Effacement (%): Thick Station: -3 Presentation: Vertex Exam by:: AMarcene Duos RN   A&P: 29y.o. G1P0 325w5dresenting for IOL secondary to worsening cHTN, now with SIPE with severe features based on blood pressures.  #Labor: S/p 2 doses of cytotec (last dose at 2016). FB placed with speculum at 2150. Will plan to transition to pitocin if contractions space out s/p initiation of magnesium. #Pain: pt plans for epidural #FWB: Category 1 strip #GBS negative #cHTN with SIPE with SF: no BP medications in pregnancy. Pt initially required 1 dose of IV labetalol on arrival to L&D for severe range Bps but magnesium was not initiated given no concerning symptoms and all subsequent blood pressures within mild range. However, given recurrent severe range blood pressures requiring additional IV labetalol will start IV magnesium at this time. Reassuringly, preeclampsia labs wnl and UP:C 0.18 on admission. Will administer tylenol for mild HA and continue to monitor with q2hr magnesium checks per nursing protocol. #Rubella Non-immune: plan for MMR in postpartum period  AnRanda NgoMD 12:06 AM

## 2020-05-12 NOTE — Progress Notes (Signed)
Labor Progress Note Lori Harris is a 29 y.o. G1P0 at 34w5dpresented for IOL secondary to worsening cHTN, now with SIPE with severe features based on blood pressures.  S: Doing well overall. Starting to have a headache again and endorses some blurry vision. States tylenol helps her headaches minimally. Otherwise without complaints.  O:  BP 131/72   Pulse 83   Temp 98.1 F (36.7 C) (Axillary)   Resp 16   Ht 5' (1.524 m)   Wt 118.7 kg   LMP 08/05/2019   SpO2 98%   BMI 51.11 kg/m  EFM: baseline 140/moderate variability/+accels/no decels Toco: q1-3 minutes  CVE: Dilation: 5 Effacement (%): 50 Station: -1 Presentation: Vertex Exam by:: [place] MD   A&P: 29y.o. G1P0 352w5dresenting for IOL secondary to worsening cHTN, now with SIPE with severe features based on blood pressures.  #Labor: S/p cytotec x1/FB. No cervical change since last exam, IUPC placed. Currently on pitocin, continue to titrate. #Pain: CSE #FWB: Category 1 #GBS negative #cHTN with SIPE with SF: preE labs nml. On Mg. Anti-hypertensive protocol ordered. Continues to have headaches, will try Fioricet.  #Rubella Non-immune: plan for MMR in postpartum period #LGA: SD precautions during delivery  AlArrie SenateMD 11:49 AM

## 2020-05-13 ENCOUNTER — Encounter (HOSPITAL_COMMUNITY): Payer: Self-pay | Admitting: Obstetrics and Gynecology

## 2020-05-13 ENCOUNTER — Inpatient Hospital Stay (HOSPITAL_COMMUNITY): Payer: 59

## 2020-05-13 DIAGNOSIS — Z6841 Body Mass Index (BMI) 40.0 and over, adult: Secondary | ICD-10-CM

## 2020-05-13 DIAGNOSIS — O1414 Severe pre-eclampsia complicating childbirth: Secondary | ICD-10-CM

## 2020-05-13 DIAGNOSIS — O1410 Severe pre-eclampsia, unspecified trimester: Secondary | ICD-10-CM

## 2020-05-13 DIAGNOSIS — Z3A37 37 weeks gestation of pregnancy: Secondary | ICD-10-CM

## 2020-05-13 DIAGNOSIS — O114 Pre-existing hypertension with pre-eclampsia, complicating childbirth: Principal | ICD-10-CM

## 2020-05-13 LAB — COMPREHENSIVE METABOLIC PANEL
ALT: 14 U/L (ref 0–44)
AST: 22 U/L (ref 15–41)
Albumin: 2.2 g/dL — ABNORMAL LOW (ref 3.5–5.0)
Alkaline Phosphatase: 105 U/L (ref 38–126)
Anion gap: 9 (ref 5–15)
BUN: 6 mg/dL (ref 6–20)
CO2: 20 mmol/L — ABNORMAL LOW (ref 22–32)
Calcium: 7.2 mg/dL — ABNORMAL LOW (ref 8.9–10.3)
Chloride: 103 mmol/L (ref 98–111)
Creatinine, Ser: 0.79 mg/dL (ref 0.44–1.00)
GFR, Estimated: >60 mL/min (ref >60)
Glucose, Bld: 105 mg/dL — ABNORMAL HIGH (ref 70–99)
Potassium: 3.7 mmol/L (ref 3.5–5.1)
Sodium: 132 mmol/L — ABNORMAL LOW (ref 135–145)
Total Bilirubin: 0.4 mg/dL (ref 0.3–1.2)
Total Protein: 4.9 g/dL — ABNORMAL LOW (ref 6.5–8.1)

## 2020-05-13 LAB — CBC
HCT: 31 % — ABNORMAL LOW (ref 36.0–46.0)
Hemoglobin: 10.1 g/dL — ABNORMAL LOW (ref 12.0–15.0)
MCH: 29.1 pg (ref 26.0–34.0)
MCHC: 32.6 g/dL (ref 30.0–36.0)
MCV: 89.3 fL (ref 80.0–100.0)
Platelets: 194 10*3/uL (ref 150–400)
RBC: 3.47 MIL/uL — ABNORMAL LOW (ref 3.87–5.11)
RDW: 15.5 % (ref 11.5–15.5)
WBC: 16.5 10*3/uL — ABNORMAL HIGH (ref 4.0–10.5)
nRBC: 0 % (ref 0.0–0.2)

## 2020-05-13 MED ORDER — AMLODIPINE BESYLATE 10 MG PO TABS
10.0000 mg | ORAL_TABLET | Freq: Every day | ORAL | Status: DC
  Administered 2020-05-13 – 2020-05-15 (×3): 10 mg via ORAL
  Filled 2020-05-13 (×3): qty 1

## 2020-05-13 MED ORDER — ZOLPIDEM TARTRATE 5 MG PO TABS: 5.0000 mg | ORAL_TABLET | Freq: Every evening | ORAL | Status: DC | PRN

## 2020-05-13 MED ORDER — LACTATED RINGERS IV SOLN: INTRAVENOUS | Status: DC

## 2020-05-13 MED ORDER — WITCH HAZEL-GLYCERIN EX PADS: 1.0000 "application " | MEDICATED_PAD | CUTANEOUS | Status: DC | PRN

## 2020-05-13 MED ORDER — MEASLES, MUMPS & RUBELLA VAC IJ SOLR
0.5000 mL | Freq: Once | INTRAMUSCULAR | Status: AC
  Administered 2020-05-14: 0.5 mL via SUBCUTANEOUS
  Filled 2020-05-13: qty 0.5

## 2020-05-13 MED ORDER — PRENATAL MULTIVITAMIN CH
1.0000 | ORAL_TABLET | Freq: Every day | ORAL | Status: DC
  Administered 2020-05-13 – 2020-05-14 (×2): 1 via ORAL
  Filled 2020-05-13 (×2): qty 1

## 2020-05-13 MED ORDER — MAGNESIUM SULFATE 40 GM/1000ML IV SOLN
2.0000 g/h | INTRAVENOUS | Status: AC
  Administered 2020-05-13: 2 g/h via INTRAVENOUS
  Filled 2020-05-13: qty 1000

## 2020-05-13 MED ORDER — DIBUCAINE (PERIANAL) 1 % EX OINT: 1.0000 "application " | TOPICAL_OINTMENT | CUTANEOUS | Status: DC | PRN

## 2020-05-13 MED ORDER — BENZOCAINE-MENTHOL 20-0.5 % EX AERO
1.0000 "application " | INHALATION_SPRAY | CUTANEOUS | Status: DC | PRN
  Administered 2020-05-13: 1 via TOPICAL
  Filled 2020-05-13: qty 56

## 2020-05-13 MED ORDER — ONDANSETRON HCL 4 MG/2ML IJ SOLN: 4.0000 mg | INTRAMUSCULAR | Status: DC | PRN

## 2020-05-13 MED ORDER — SENNOSIDES-DOCUSATE SODIUM 8.6-50 MG PO TABS
2.0000 | ORAL_TABLET | ORAL | Status: DC
  Administered 2020-05-13: 2 via ORAL
  Filled 2020-05-13 (×2): qty 2

## 2020-05-13 MED ORDER — SIMETHICONE 80 MG PO CHEW: 80.0000 mg | CHEWABLE_TABLET | ORAL | Status: DC | PRN

## 2020-05-13 MED ORDER — TETANUS-DIPHTH-ACELL PERTUSSIS 5-2.5-18.5 LF-MCG/0.5 IM SUSP: 0.5000 mL | Freq: Once | INTRAMUSCULAR | Status: DC

## 2020-05-13 MED ORDER — IBUPROFEN 600 MG PO TABS
600.0000 mg | ORAL_TABLET | Freq: Four times a day (QID) | ORAL | Status: DC
  Administered 2020-05-13 – 2020-05-15 (×9): 600 mg via ORAL
  Filled 2020-05-13 (×9): qty 1

## 2020-05-13 MED ORDER — COCONUT OIL OIL: 1.0000 "application " | TOPICAL_OIL | Status: DC | PRN

## 2020-05-13 MED ORDER — ONDANSETRON HCL 4 MG PO TABS: 4.0000 mg | ORAL_TABLET | ORAL | Status: DC | PRN

## 2020-05-13 MED ORDER — ACETAMINOPHEN 325 MG PO TABS: 650.0000 mg | ORAL_TABLET | ORAL | Status: DC | PRN

## 2020-05-13 MED ORDER — DIPHENHYDRAMINE HCL 25 MG PO CAPS: 25.0000 mg | ORAL_CAPSULE | Freq: Four times a day (QID) | ORAL | Status: DC | PRN

## 2020-05-13 MED ORDER — ALBUTEROL SULFATE HFA 108 (90 BASE) MCG/ACT IN AERS
1.0000 | INHALATION_SPRAY | Freq: Four times a day (QID) | RESPIRATORY_TRACT | Status: DC | PRN
  Filled 2020-05-13: qty 6.7

## 2020-05-13 NOTE — Discharge Instructions (Signed)

## 2020-05-13 NOTE — Progress Notes (Signed)
Patient screened out for psychosocial assessment since none of the following apply: °Psychosocial stressors documented in mother or baby's chart °Gestation less than 32 weeks °Code at delivery  °Infant with anomalies °Please contact the Clinical Social Worker if specific needs arise, by MOB's request, or if MOB scores greater than 9/yes to question 10 on Edinburgh Postpartum Depression Screen. ° °Sierra Spargo Boyd-Gilyard, MSW, LCSW °Clinical Social Work °[phone] °  °

## 2020-05-13 NOTE — Lactation Note (Addendum)
This note was copied from a baby's chart. Lactation Consultation Note  Patient Name: Boy Ardis Lawley [id number] Date: 05/13/2020  Baby boy Lenard Galloway now 15 hours old in NICU.  Early term infant born to mom induced with gestational hypertension. Mom is a G1 P1. Mom using her own Lansinoh DEBP.  Discussed with mom using hospital grade/multiuser DEBP.   Mom agreed.  Initiated using Medela DEBP with mom.  Urged mom to pump 8-12 times day for 15 minutes.  Reviewed how to wash pump parts.  Discussed sanitizing pump parts.  Mom has a DEBP for home use.  Discussed massage and hand expression.  Demo massage and hand expression with mom.  Mom able to demo back hand expression.  Urged parents to call lactation as needed.        Maternal Data    Feeding Feeding Type: Donor Breast Milk  LATCH Score                   Interventions    Lactation Tools Discussed/Used     Consult Status      Shenay Torti Michaelle Copas 05/13/2020, 10:37 PM

## 2020-05-13 NOTE — Progress Notes (Addendum)
Daily Antepartum Note  Admission Date: 05/11/2020 Current Date: 05/13/2020 7:33 AM  Lori Harris is a 29 y.o. G1P1001 PPD#1 SVD/2nd degree @ [redacted]w[redacted]d admitted for IOL for severe pre-x superimposed on cHTN.  Pregnancy complicated by: Patient Active Problem List   Diagnosis Date Noted  . Vaginal delivery 05/13/2020  . Severe preeclampsia 05/13/2020  . [place] 50.0-59.9[place] 05[place]13[place]  . Chronic hypertension 05/11/2020  . Large for gestational age fetus affecting management of mother, antepartum 04/13/2020  . Chronic hypertension affecting pregnancy 02/11/2020  . Obesity affecting pregnancy in second trimester 12/17/2019  . Rubella non-immune status, antepartum 11/17/2019  . Encounter for supervision of normal pregnancy 10/23/2019    Overnight/24hr events:  none  Subjective:  Meeting all PP goals. No s/s of pre-x or mg toxicity  Objective:    Current Vital Signs 24h Vital Sign Ranges  T 97.7 F (36.5 C) Temp  Avg: 98.3 F (36.8 C)  Min: 97.7 F (36.5 C)  Max: 99.1 F (37.3 C)  BP (!) 153/97 BP  Min: 93/50  Max: 160/127  HR 80 Pulse  Avg: 87.6  Min: 63  Max: 121  RR 18 Resp  Avg: 16.7  Min: 15  Max: 19  SaO2 98 %   SpO2  Avg: 98.9 %  Min: 97 %  Max: 100 %       24 Hour I/O Current Shift I/O  Time Ins Outs 10/12 0701 - 10/13 0700 In: 3735.7 [P.O.:860; I.V.:2875.7] Out: 33976[Urine:2785] No intake/output data recorded.   Patient Vitals for the past 24 hrs:  BP Temp Temp src Pulse Resp SpO2  05/13/20 0522 (!) 153/97 97.7 F (36.5 C) Oral 80 18 98 %  05/13/20 0400 -- -- -- -- 18 --  05/13/20 0324 -- -- -- -- 18 --  05/13/20 0209 (!) 146/86 98.7 F (37.1 C) Oral 88 18 99 %  05/13/20 0145 -- -- -- -- 17 --  05/13/20 0050 (!) 154/97 99.1 F (37.3 C) Oral (!) 103 19 98 %  05/13/20 0016 (!) 150/83 -- -- 98 -- --  05/13/20 0001 (!) 154/82 -- -- 100 -- --  05/12/20 2346 (!) 155/90 -- -- 100 17 --  05/12/20 2331 (!) 152/91 -- -- (!) 107 -- --  05/12/20 2325 (!)  156/92 -- -- 100 17 --  05/12/20 2301 (!) 160/127 -- -- (!) 121 -- --  05/12/20 2250 -- -- -- -- 17 --  05/12/20 2246 119/76 -- -- (!) 116 -- --  05/12/20 2231 130/75 -- -- (!) 108 -- --  05/12/20 2219 124/82 -- -- (!) 105 18 --  05/12/20 2205 -- -- -- -- -- 99 %  05/12/20 2201 115/90 -- -- (!) 116 -- --  05/12/20 2200 -- -- -- -- -- 98 %  05/12/20 2156 139/89 -- -- 97 17 --  05/12/20 2155 -- -- -- -- -- 98 %  05/12/20 2135 -- -- -- -- -- 100 %  05/12/20 2131 140/86 -- -- (!) 105 -- --  05/12/20 2100 (!) 143/80 98.1 F (36.7 C) Oral (!) 101 17 98 %  05/12/20 2055 -- -- -- -- -- 98 %  05/12/20 2030 138/85 -- -- (!) 113 17 100 %  05/12/20 2001 132/72 -- -- (!) 108 17 99 %  05/12/20 2000 -- -- -- -- -- 99 %  05/12/20 1931 134/82 -- -- (!) 107 17 --  05/12/20 1901 138/82 98 F (36.7 C) Oral 98 16 --  05/12/20 1838  119/78 -- -- 89 17 --  05/12/20 1801 125/73 -- -- 73 16 --  05/12/20 1731 126/69 -- -- 72 16 --  05/12/20 1701 122/71 97.9 F (36.6 C) Axillary 67 16 --  05/12/20 1631 121/81 -- -- 89 16 --  05/12/20 1601 136/62 -- -- 86 16 --  05/12/20 1531 (!) 138/54 -- -- 66 17 --  05/12/20 1501 (!) 135/59 98.2 F (36.8 C) Oral 68 17 --  05/12/20 1431 (!) 121/58 -- -- 67 17 --  05/12/20 1416 122/65 -- -- 70 17 --  05/12/20 1331 133/70 -- -- 76 16 --  05/12/20 1301 122/65 98.9 F (37.2 C) Oral 65 17 --  05/12/20 1231 126/67 -- -- 79 16 --  05/12/20 1201 124/75 -- -- 73 16 --  05/12/20 1135 131/72 -- -- 83 16 --  05/12/20 1131 (!) 160/104 -- -- 68 16 --  05/12/20 1101 139/83 98.1 F (36.7 C) Axillary 63 17 --  05/12/20 1031 129/79 -- -- 69 16 --  05/12/20 1001 129/84 -- -- 73 16 --  05/12/20 0941 (!) 112/56 -- -- 78 16 --  05/12/20 0940 -- -- -- -- -- 98 %  05/12/20 0936 (!) 102/54 -- -- 76 16 --  05/12/20 0935 -- -- -- -- -- 99 %  05/12/20 0931 109/65 98 F (36.7 C) Oral 73 16 --  05/12/20 0930 -- -- -- -- -- 97 %  05/12/20 0926 (!) 93/50 -- -- 80 16 --  05/12/20 0925 --  -- -- -- -- 98 %  05/12/20 0921 114/79 -- -- 86 16 --  05/12/20 0920 -- -- -- -- -- 100 %  05/12/20 0916 113/86 -- -- (!) 102 16 --  05/12/20 0915 -- -- -- -- -- 100 %  05/12/20 0911 (!) 134/91 -- -- 84 16 --  05/12/20 0910 -- -- -- -- -- 100 %  05/12/20 0906 132/82 -- -- 88 16 --  05/12/20 0905 -- -- -- -- -- 100 %  05/12/20 0904 (!) 136/91 -- -- 91 15 --  05/12/20 0901 (!) 129/111 -- -- 81 17 --  05/12/20 0900 -- -- -- -- -- 100 %  05/12/20 0831 (!) 150/73 -- -- 76 17 --  05/12/20 0802 130/69 -- -- 86 17 --   UOP: >165m/hr  Physical exam: General: Well nourished, well developed female in no acute distress. Abdomen: obese, nttp Cardiovascular: S1, S2 normal, no murmur, rub or gallop, regular rate and rhythm Respiratory: CTAB Extremities: no clubbing, cyanosis or edema Skin: Warm and dry.   Medications: Current Facility-Administered Medications  Medication Dose Route Frequency Provider Last Rate Last Admin  . acetaminophen (TYLENOL) tablet 650 mg  650 mg Oral Q4H PRN DSloan Leiter MD   650 mg at 05/12/20 1117  . acetaminophen (TYLENOL) tablet 650 mg  650 mg Oral Q4H PRN WSeabron Spates CNM      . albuterol (VENTOLIN HFA) 108 (90 Base) MCG/ACT inhaler 1-2 puff  1-2 puff Inhalation Q6H PRN WSeabron Spates CNM      . amLODipine (NORVASC) tablet 10 mg  10 mg Oral Daily GRanda Ngo MD      . benzocaine-Menthol (DERMOPLAST) 20-0.5 % topical spray 1 application  1 application Topical PRN WSeabron Spates CNM      . coconut oil  1 application Topical PRN WSeabron Spates CNM      . witch hazel-glycerin (TUCKS) pad 1 application  1 application Topical PRN WJimmye Norman  Wilkie Aye, CNM       And  . dibucaine (NUPERCAINAL) 1 % rectal ointment 1 application  1 application Rectal PRN Seabron Spates, CNM      . diphenhydrAMINE (BENADRYL) capsule 25 mg  25 mg Oral Q6H PRN Seabron Spates, CNM      . diphenhydrAMINE (BENADRYL) injection 12.5 mg  12.5 mg Intravenous Q15 min PRN  Janeece Riggers, MD      . ePHEDrine injection 10 mg  10 mg Intravenous PRN Janeece Riggers, MD      . ePHEDrine injection 10 mg  10 mg Intravenous PRN Janeece Riggers, MD      . fentaNYL 2 mcg/mL w/ bupivacaine 0.125% in NS 250 mL epidural infusion ([facility])  12 mL/hr Epidural Continuous PRN Janeece Riggers, MD   Stopped at 05/12/20 2234  . labetalol (NORMODYNE) injection 20 mg  20 mg Intravenous PRN Randa Ngo, MD   20 mg at 05/11/20 2346   And  . labetalol (NORMODYNE) injection 40 mg  40 mg Intravenous PRN Randa Ngo, MD   40 mg at 05/12/20 0001   And  . labetalol (NORMODYNE) injection 80 mg  80 mg Intravenous PRN Randa Ngo, MD   80 mg at 05/12/20 0152   And  . hydrALAZINE (APRESOLINE) injection 10 mg  10 mg Intravenous PRN Randa Ngo, MD   10 mg at 05/12/20 0204  . ibuprofen (ADVIL) tablet 600 mg  600 mg Oral Q6H Seabron Spates, CNM   600 mg at 05/13/20 0557  . lactated ringers infusion 500-1,000 mL  500-1,000 mL Intravenous PRN Sloan Leiter, MD      . lactated ringers infusion   Intravenous Continuous Sloan Leiter, MD 55 mL/hr at 05/13/20 0006 New Bag at 05/13/20 0006  . lactated ringers infusion   Intravenous Continuous Seabron Spates, CNM      . lidocaine (PF) (XYLOCAINE) 1 % injection 30 mL  30 mL Subcutaneous PRN Sloan Leiter, MD      . magnesium sulfate 40 grams in SWI 1000 mL OB infusion  2 g/hr Intravenous Continuous Aletha Halim, MD      . measles, mumps & rubella vaccine (MMR) injection 0.5 mL  0.5 mL Subcutaneous Once Aletha Halim, MD      . misoprostol (CYTOTEC) 200 MCG tablet           . misoprostol (CYTOTEC) tablet 25 mcg  25 mcg Vaginal Q4H PRN Sloan Leiter, MD   25 mcg at 05/11/20 2016  . ondansetron (ZOFRAN) injection 4 mg  4 mg Intravenous Q4H PRN Arrie Senate, MD   4 mg at 05/12/20 1821  . ondansetron (ZOFRAN) tablet 4 mg  4 mg Oral Q4H PRN Seabron Spates, CNM       Or  . ondansetron [facility]) injection 4 mg  4 mg  Intravenous Q4H PRN Seabron Spates, CNM      . oxyCODONE-acetaminophen (PERCOCET/ROXICET) 5-325 MG per tablet 1 tablet  1 tablet Oral Q4H PRN Sloan Leiter, MD      . oxyCODONE-acetaminophen (PERCOCET/ROXICET) 5-325 MG per tablet 2 tablet  2 tablet Oral Q4H PRN Sloan Leiter, MD      . oxytocin (PITOCIN) IV infusion 30 units in NS 500 mL - Premix  2.5 Units/hr Intravenous Continuous Sloan Leiter, MD 41.7 mL/hr at 05/12/20 2240 2.5 Units/hr at 05/12/20 2240  . oxytocin (PITOCIN) IV infusion 30 units in NS 500 mL -  Premix  1-40 milli-units/min Intravenous Titrated Randa Ngo, MD   Stopped at 05/12/20 2208  . PHENYLephrine 40 mcg/ml in normal saline Adult IV Push Syringe (For Blood Pressure Support)  80 mcg Intravenous PRN Janeece Riggers, MD      . PHENYLephrine 40 mcg/ml in normal saline Adult IV Push Syringe (For Blood Pressure Support)  80 mcg Intravenous PRN Janeece Riggers, MD   80 mcg at 05/12/20 0925  . prenatal multivitamin tablet 1 tablet  1 tablet Oral Q1200 Seabron Spates, CNM      . senna-docusate (Senokot-S) tablet 2 tablet  2 tablet Oral Q24H Seabron Spates, CNM      . simethicone (MYLICON) chewable tablet 80 mg  80 mg Oral PRN Seabron Spates, CNM      . sodium citrate-citric acid (ORACIT) solution 30 mL  30 mL Oral Q2H PRN Sloan Leiter, MD      . Tdap (BOOSTRIX) injection 0.5 mL  0.5 mL Intramuscular Once Seabron Spates, CNM      . terbutaline (BRETHINE) injection 0.25 mg  0.25 mg Subcutaneous Once PRN Sloan Leiter, MD      . zolpidem [facility]) tablet 5 mg  5 mg Oral QHS PRN Seabron Spates, CNM        Labs:  Recent Labs  Lab 05/12/20 0749 05/12/20 2304 05/13/20 0619  WBC 13.6* 21.8* 16.5*  HGB 11.4* 12.0 10.1*  HCT 35.0* 38.0 31.0*  PLT 211 258 194    Recent Labs  Lab 05/11/20 1530  NA 136  K 4.2  CL 104  CO2 21*  BUN 7  CREATININE 0.70  CALCIUM 9.5  PROT 6.5  BILITOT 0.4  ALKPHOS 132*  ALT 16  AST 22  GLUCOSE 72   CMP  pending  Radiology:  none  Assessment & Plan:  Pt stable *PP: A POS. Breast. BC undecided. circ (baby in nicu currently). MMR ordered.  *cHTN with severe pre-x (BP): f/u cmp. norvasc to start today. Continue mg x 24 (2200 tonight) *PPx: SCDs *FEN/GI: regular diet, MIVF *Dispo: likely PPD#2-3  Durene Romans MD [facility] [place] [facility] GYN Consult Phone: [phone] (M-F, 0800-1700) & [phone] (Off hours, weekends, holidays)

## 2020-05-13 NOTE — Anesthesia Postprocedure Evaluation (Signed)
Anesthesia Post Note  Patient: Dashanti Burr  Procedure(s) Performed: AN AD HOC LABOR EPIDURAL     Patient location during evaluation: Mother Baby Anesthesia Type: Epidural Level of consciousness: awake and alert Pain management: pain level controlled Vital Signs Assessment: post-procedure vital signs reviewed and stable Respiratory status: spontaneous breathing Cardiovascular status: blood pressure returned to baseline Postop Assessment: no headache, adequate PO intake, no backache, patient able to bend at knees, able to ambulate, epidural receding and no apparent nausea or vomiting Anesthetic complications: no   No complications documented.  Last Vitals:  Vitals:   05/13/20 0522 05/13/20 0808  BP: (!) 153/97 (!) 159/103  Pulse: 80 81  Resp: 18 18  Temp: 36.5 C 37 C  SpO2: 98%     Last Pain:  Vitals:   05/13/20 0808  TempSrc: Oral  PainSc:    Pain Goal:                   Salome Arnt

## 2020-05-13 NOTE — Discharge Summary (Signed)
Postpartum Discharge Summary  Date of Service updated 05/15/20     Patient Name: Lori Harris DOB: November 24, 1990 MRN: [id number]  Date of admission: 05/11/2020 Delivery date:05/12/2020  Delivering provider: Randa Ngo  Date of discharge: 05/15/2020  Admitting diagnosis: Chronic hypertension [I10] Intrauterine pregnancy: [redacted]w[redacted]d    Secondary diagnosis:  Active Problems:   Rubella non-immune status, antepartum   Obesity affecting pregnancy in second trimester   Chronic hypertension   Vaginal delivery   Severe preeclampsia   BMI 50.0-59.9[place]  Additional problems: as noted above   Discharge diagnosis: Vaginal delivery in setting of Preeclampsia with severe features                              Post partum procedures:24 hours of postpartum magnesium Augmentation: AROM, Pitocin, Cytotec and IP Foley Complications: None  Hospital course: Induction of Labor With Vaginal Delivery   29y.o. yo G1P1001 at 358w6das admitted to the hospital 05/11/2020 for induction of labor.  Indication for induction: cHTN with superimposed preeclampsia with severe features (by blood pressures).  Patient had an uncomplicated labor course as follows: Membrane Rupture Time/Date: 7:17 AM ,05/12/2020   Delivery Method:Vaginal, Spontaneous  Episiotomy: None  Lacerations:  2nd degree  Details of delivery can be found in separate delivery note.  Patient received magnesium x 24 hours and then antihypertensive medication was add for BP control. BP 140's/80-90's at time of discharge. She otherwise progressed to ambulating, voiding, tolerating diet and good oral pain control. Felt amendable for discharge home on 05/15/20. Discharge instructions, nedications and follow up were reviewed with pt. She verbalized understanding. Patient is discharged home 05/15/20.  Newborn Data: Birth date:05/12/2020  Birth time:10:06 PM  Gender:Female  Living status:Living  Apgars:8 ,8  Weight:3260 g    Magnesium Sulfate received: Yes: Seizure prophylaxis BMZ received: No Rhophylac:N/A MMR:Yes T-DaP:Given prenatally Flu: Yes Transfusion:No  Physical exam  Vitals:   05/14/20 2148 05/15/20 0119 05/15/20 0505 05/15/20 0851  BP: (!) 151/92 (!) 147/82 (!) 140/100 (!) 146/96  Pulse: 97 (!) 104 100 80  Resp: '18 18 18 18  ' Temp: 97.8 F (36.6 C) 98 F (36.7 C) 98.3 F (36.8 C) 98.2 F (36.8 C)  TempSrc: Oral Oral Oral Oral  SpO2: 100% 100% 99% 99%  Weight:      Height:       General: alert Lochia: appropriate Uterine Fundus: firm Incision: Healing well with no significant drainage DVT Evaluation: No evidence of DVT seen on physical exam. Labs: Lab Results  Component Value Date   WBC 16.5 (H) 05/13/2020   HGB 10.1 (L) 05/13/2020   HCT 31.0 (L) 05/13/2020   MCV 89.3 05/13/2020   PLT 194 05/13/2020   CMP Latest Ref Rng & Units 05/13/2020  Glucose 70 - 99 mg/dL 105(H)  BUN 6 - 20 mg/dL 6  Creatinine 0.44 - 1.00 mg/dL 0.79  Sodium 135 - 145 mmol/L 132(L)  Potassium 3.5 - 5.1 mmol/L 3.7  Chloride 98 - 111 mmol/L 103  CO2 22 - 32 mmol/L 20(L)  Calcium 8.9 - 10.3 mg/dL 7.2(L)  Total Protein 6.5 - 8.1 g/dL 4.9(L)  Total Bilirubin 0.3 - 1.2 mg/dL 0.4  Alkaline Phos 38 - 126 U/L 105  AST 15 - 41 U/L 22  ALT 0 - 44 U/L 14   Edinburgh Score: Edinburgh Postnatal Depression Scale Screening Tool 05/14/2020  I have been able to laugh and see the  funny side of things. 1  I have looked forward with enjoyment to things. 0  I have blamed myself unnecessarily when things went wrong. 2  I have been anxious or worried for no good reason. 2  I have felt scared or panicky for no good reason. 0  Things have been getting on top of me. 0  I have been so unhappy that I have had difficulty sleeping. 0  I have felt sad or miserable. 1  I have been so unhappy that I have been crying. 1  The thought of harming myself has occurred to me. 0  Edinburgh Postnatal Depression Scale Total 7      After visit meds:  Allergies as of 05/15/2020   No Known Allergies     Medication List    STOP taking these medications   albuterol 108 (90 Base) MCG/ACT inhaler Commonly known as: VENTOLIN HFA   aspirin EC 81 MG tablet   LORATADINE PO     TAKE these medications   amLODipine 10 MG tablet Commonly known as: NORVASC Take 1 tablet (10 mg total) by mouth daily. Start taking on: May 16, 2020   Breast Pump Misc 1 Device by Does not apply route as needed.   ibuprofen 600 MG tablet Commonly known as: ADVIL Take 1 tablet (600 mg total) by mouth every 6 (six) hours.   lisinopril 10 MG tablet Commonly known as: ZESTRIL Take 1 tablet (10 mg total) by mouth daily. Start taking on: May 16, 2020   PRENATAL VITAMINS PO Take by mouth.        Discharge home in stable condition Infant Feeding: Breast Infant Disposition:home with mother Discharge instruction: per After Visit Summary and Postpartum booklet. Activity: Advance as tolerated. Pelvic rest for 6 weeks.  Diet: routine diet Future Appointments: Future Appointments  Date Time Provider [place]  05/18/2020 11:00 AM Guss Bunde, MD [facility]  06/09/2020 10:50 AM Leftwich-Kirby, Kathie Dike, CNM [facility]   Follow up Visit: Message sent to [facility] to schedule PP appt and BP check in 1 week  Please schedule this patient for a In person postpartum visit in 4 weeks with the following provider: Any provider. Additional Postpartum F/U:BP check 1 week  High risk pregnancy complicated by: cHTN with superimposed preeclampsia with severe features (by blood pressures), rubella non-immune Delivery mode:  Vaginal, Spontaneous  Anticipated Birth Control:  Unsure  05/15/2020 Chancy Milroy, MD

## 2020-05-14 MED ORDER — FUROSEMIDE 10 MG/ML IJ SOLN
INTRAMUSCULAR | Status: AC
  Filled 2020-05-14: qty 2

## 2020-05-14 MED ORDER — FUROSEMIDE 10 MG/ML IJ SOLN
40.0000 mg | Freq: Once | INTRAMUSCULAR | Status: AC
  Administered 2020-05-14: 40 mg via INTRAVENOUS
  Filled 2020-05-14: qty 4

## 2020-05-14 MED ORDER — LISINOPRIL 10 MG PO TABS
10.0000 mg | ORAL_TABLET | Freq: Every day | ORAL | Status: DC
  Administered 2020-05-14 – 2020-05-15 (×2): 10 mg via ORAL
  Filled 2020-05-14 (×2): qty 1

## 2020-05-14 NOTE — Progress Notes (Signed)
Post Partum Day 2 Subjective: up ad lib, voiding, tolerating PO and Increasingly lower extremity edema  Objective: Blood pressure (!) 154/85, pulse 85, temperature 98.2 F (36.8 C), temperature source Oral, resp. rate 18, height 5' (1.524 m), weight 118.7 kg, last menstrual period 08/05/2019, SpO2 98 %, unknown if currently breastfeeding.  Intake/Output Summary (Last 24 hours) at 05/14/2020 0753 Last data filed at 05/13/2020 2300 Gross per 24 hour  Intake 1583.13 ml  Output 4900 ml  Net -3316.87 ml    Physical Exam:  General: alert, cooperative and appears stated age 10: appropriate Uterine Fundus: firm DVT Evaluation: No evidence of DVT seen on physical exam.  Recent Labs    05/12/20 2304 05/13/20 0619  HGB 12.0 10.1*  HCT 38.0 31.0*    Assessment/Plan: Plan for discharge tomorrow, Breastfeeding, Circumcision prior to discharge and Contraception Female condoms We will give 1 dose of IV Lasix today to improve her swelling.  Patient is net -3 L. Have started lisinopril 10 mg p.o. daily to improve her blood pressures which are still in the 140-150/80-100 range. We will continue Norvasc 10 mg daily.   LOS: 3 days   Reva Bores 05/14/2020, 7:51 AM

## 2020-05-14 NOTE — Lactation Note (Signed)
This note was copied from a baby's chart. Lactation Consultation Note  Patient Name: Boy Kynleigh Artz [id number] Date: 05/14/2020 Reason for consult: Follow-up assessment;Primapara;1st time breastfeeding;NICU baby;Early term 37-38.6wks  LC in to visit with P1 Mom of ET infant in the NICU.  Baby is 41 hrs old and being gavage fed and on 2 L HFNC.    Mom and FOB at beside.  Mom states she obtains colostrum with every pumping.  4 colostrum containers noted in refrigerator.  Baby's nurse has done oral care with colostrum and gavage fed colostrum prior to the donor milk.    Encouraged Mom to continue using the Medela Symphony rather than her personal Lansinoh pump to support a full milk supply.   Encouraged STS when she is able to, and instructed Mom to try to double pump following.    Mom aware of lactation support available to her.  Mom to call for LC prn.  Interventions Interventions: Breast feeding basics reviewed;Skin to skin;Breast massage;Hand express;DEBP  Lactation Tools Discussed/Used Tools: Pump Breast pump type: Double-Electric Breast Pump   Consult Status Consult Status: Follow-up Date: 05/15/20 Follow-up type: In-patient    [name] 05/14/2020, 3:58 PM

## 2020-05-15 ENCOUNTER — Other Ambulatory Visit (HOSPITAL_COMMUNITY): Payer: Self-pay | Admitting: Obstetrics and Gynecology

## 2020-05-15 MED ORDER — IBUPROFEN 600 MG PO TABS
600.0000 mg | ORAL_TABLET | Freq: Four times a day (QID) | ORAL | 0 refills | Status: DC
Start: 2020-05-15 — End: 2020-05-15

## 2020-05-15 MED ORDER — LISINOPRIL 10 MG PO TABS
10.0000 mg | ORAL_TABLET | Freq: Every day | ORAL | 1 refills | Status: DC
Start: 2020-05-16 — End: 2020-05-16

## 2020-05-15 MED ORDER — LISINOPRIL 10 MG PO TABS
10.0000 mg | ORAL_TABLET | Freq: Every day | ORAL | 1 refills | Status: DC
Start: 2020-05-16 — End: 2020-05-15

## 2020-05-15 MED ORDER — AMLODIPINE BESYLATE 10 MG PO TABS
10.0000 mg | ORAL_TABLET | Freq: Every day | ORAL | 1 refills | Status: DC
Start: 2020-05-16 — End: 2020-05-15

## 2020-05-15 MED ORDER — AMLODIPINE BESYLATE 10 MG PO TABS
10.0000 mg | ORAL_TABLET | Freq: Every day | ORAL | 1 refills | Status: DC
Start: 2020-05-16 — End: 2020-05-16

## 2020-05-15 MED FILL — AMLODIPINE BESYLATE 10 MG T: 10 | 30 days supply | Qty: 30 | Fill #0

## 2020-05-15 MED FILL — LISINOPRIL 10 MG TABS: 10 | 30 days supply | Qty: 30 | Fill #0

## 2020-05-15 MED FILL — IBUPROFEN 600 MG TABLET: 600 | 7 days supply | Qty: 30 | Fill #0

## 2020-05-15 NOTE — Lactation Note (Signed)
This note was copied from a baby's chart. Lactation Consultation Note  Patient Name: Boy Krystine Pabst [id number] Date: 05/15/2020   LC attempted to visit mom but she was visiting NICU.  Plan is to visit in mom while she's in NICU or attempt another visit later today in [facility] SP care.  Maternal Data    Feeding Feeding Type: Breast Milk (for oral care)  LATCH Score                   Interventions    Lactation Tools Discussed/Used     Consult Status      Maryruth Hancock [facility] 05/15/2020, 11:11 AM

## 2020-05-18 ENCOUNTER — Ambulatory Visit (INDEPENDENT_AMBULATORY_CARE_PROVIDER_SITE_OTHER): Payer: 59 | Admitting: Obstetrics & Gynecology

## 2020-05-18 ENCOUNTER — Other Ambulatory Visit: Payer: Self-pay

## 2020-05-18 ENCOUNTER — Encounter: Payer: Self-pay | Admitting: Obstetrics & Gynecology

## 2020-05-18 ENCOUNTER — Ambulatory Visit: Payer: Self-pay

## 2020-05-18 VITALS — BP 136/89 | HR 100 | Resp 17 | Ht 60.0 in | Wt 243.0 lb

## 2020-05-18 DIAGNOSIS — O1410 Severe pre-eclampsia, unspecified trimester: Secondary | ICD-10-CM

## 2020-05-18 MED ORDER — ADULT BLOOD PRESSURE CUFF LG KIT: PACK | 0 refills | Status: AC

## 2020-05-18 NOTE — Progress Notes (Signed)
   Subjective:    Patient ID: Lori Harris, female    DOB: 03/31/1991, 29 y.o.   MRN: [id number]  HPI  Pt doing well post partum.  Does not have BP cuff at home.  Does take both BP meds daily as directed.  No HA/scot/RUQ pain.  Still has swollen LE but getting better.  Baby in NICU.    Review of Systems  Constitutional: Negative.   Eyes: Negative.   Respiratory: Negative.   Cardiovascular: Negative.   Gastrointestinal: Negative.   Musculoskeletal: Negative.   Psychiatric/Behavioral: Negative.        Objective:   Physical Exam Vitals reviewed.  Constitutional:      General: She is not in acute distress.    Appearance: She is well-developed.  HENT:     Head: Normocephalic and atraumatic.  Eyes:     Conjunctiva/sclera: Conjunctivae normal.  Cardiovascular:     Rate and Rhythm: Normal rate.  Pulmonary:     Effort: Pulmonary effort is normal.  Musculoskeletal:     Right lower leg: Edema present.     Left lower leg: Edema present.     Comments: 1+ bilateral pitting edema  Skin:    General: Skin is warm and dry.  Neurological:     Mental Status: She is alert and oriented to person, place, and time.    Vitals:   05/18/20 1052  BP: 136/89  Pulse: 100  Resp: 17  Weight: 243 lb (110.2 kg)  Height: 5' (1.524 m)        Assessment & Plan:  29 yo female s/p induction for severe Pre E 1.  Pt asked to buy BP cuff and take daily.  If >140/90, send my chart message.  If >160/110, call the office or go to the hospital if not during business hours.  Pre-E precautions given.   2.  Continue BP meds 3.  DAily BPs 4.  RTC for routine PP visit.

## 2020-05-18 NOTE — Lactation Note (Signed)
This note was copied from a baby's chart. Lactation Consultation Note  Patient Name: Lori Harris [id number] Date: 05/18/2020 Reason for consult: Follow-up assessment;Primapara;1st time breastfeeding;NICU baby;Early term 37-38.6wks;Hyperbilirubinemia   LC in to assist with P1 Mom of ET infant in the NICU.  Baby 65 days old.  Baby has been gavage fed EBM or DM and today started with po feedings.  Baby took 23 ml by bottle.    Mom has been pumping and her supply is increasing to 30-60 ml per pumping.    Mom desires to breastfeed.  Mom assisted with positioning baby in cross cradle hold on right side.  Mom has compressible breasts and short shafted nipples.  Baby showing subtle cues, opening wide onto breast but unable to latch.    On digital assessment, baby noted to have an elevated palate behind anterior gum line.  Tongue remains flat and doesn't cup finger and baby fussy not sucking.  Suspected posterior short frenulum, but unable to visualize it.  Initiated a 20 mm nipple shield, showing Mom how to properly apply this.  Nipple pulled into shield.  Instilled 1 ml of EBM into shield.  Baby opened after several attempts and only sucked rhythmically for 3 minutes after instilling EBM into shield.  Baby stayed deeply latched without sucking.  Reassured Mom and FOB that this was first attempt and baby would learn as he matures.   Encouraged Mom to do a lot of STS with baby and pump both breast consistently to support a full milk supply.    Feeding Feeding Type: Breast Fed Nipple Type: Dr. Levert Feinstein Preemie  LATCH Score Latch: Repeated attempts needed to sustain latch, nipple held in mouth throughout feeding, stimulation needed to elicit sucking reflex.  Audible Swallowing: None (one swallow identified)  Type of Nipple: Everted at rest and after stimulation (short nipple shafts)  Comfort (Breast/Nipple): Soft / non-tender  Hold (Positioning): Assistance needed to correctly  position infant at breast and maintain latch.  LATCH Score: 6  Interventions Interventions: Breast feeding basics reviewed;Assisted with latch;Skin to skin;Breast massage;Breast compression;Adjust position;Support pillows;Position options;Expressed milk;DEBP  Lactation Tools Discussed/Used Tools: Nipple Shields;Pump;Bottle Nipple shield size: 20 Breast pump type: Double-Electric Breast Pump   Consult Status Consult Status: Follow-up Date: 05/19/20 Follow-up type: In-patient    [name] 05/18/2020, 2:41 PM

## 2020-05-19 ENCOUNTER — Ambulatory Visit: Payer: Self-pay

## 2020-05-19 NOTE — Lactation Note (Signed)
This note was copied from a baby's chart. Lactation Consultation Note  Patient Name: Lori Harris [id number] Date: 05/19/2020   Avala spoke with baby's RN.  Mom has been frustrated and has decided to exclusively bottle feed.  Baby has been fussy at the breast.  RN suggested we revisit at another time.    Appt made for Friday 10/22 @ 12 n.    RN is encouraging Mom to do STS with baby, consistently pump and will present lactation consultation to Mom on Friday.  RN will call for assistance if needed earlier.    [name] 05/19/2020, 4:34 PM

## 2020-05-22 ENCOUNTER — Ambulatory Visit: Payer: Self-pay

## 2020-05-22 NOTE — Lactation Note (Signed)
This note was copied from a baby's chart. Lactation Consultation Note  Patient Name: Lori Harris Date: 05/22/2020 Reason for consult: Follow-up assessment;NICU baby LC to room for f/u visit. Mom is exclusively pumping and bottle feeding. She plans to re-challenge at the breast when she and baby are home but at this time is focused on "getting baby out of the NICU." This LC offered to assist further prn. Mom has all needed pumping supplies at this time.    Interventions Interventions: DEBP;Breast feeding basics reviewed    Consult Status Consult Status: PRN Date: 05/23/20    Elder Negus 05/22/2020, 2:27 PM

## 2020-06-08 NOTE — Progress Notes (Signed)
Post Partum Visit Note  Lori Harris is a 29 y.o. G70P1001 female who presents for a postpartum visit. She is 4 weeks postpartum following a normal spontaneous vaginal delivery.  I have fully reviewed the prenatal and intrapartum course. The delivery was at 38 gestational weeks due to [facility] Anesthesia: epidural. Postpartum course has been remarkable. Baby is doing well. Baby is feeding by breast and bottle- Enfamil. Bleeding no bleeding. Bowel function is normal. Bladder function is normal. Patient is not sexually active. Contraception method is none. Postpartum depression screening: negative.   The pregnancy intention screening data noted above was reviewed. Potential methods of contraception were discussed. The patient elected to proceed with Female Condom.    Edinburgh Postnatal Depression Scale - 06/09/20 1044      Edinburgh Postnatal Depression Scale:  In the Past 7 Days   I have been able to laugh and see the funny side of things. 0    I have looked forward with enjoyment to things. 0    I have blamed myself unnecessarily when things went wrong. 0    I have been anxious or worried for no good reason. 1    I have felt scared or panicky for no good reason. 0    Things have been getting on top of me. 0    I have been so unhappy that I have had difficulty sleeping. 0    I have felt sad or miserable. 0    I have been so unhappy that I have been crying. 0    The thought of harming myself has occurred to me. 0    Edinburgh Postnatal Depression Scale Total 1            The following portions of the patient's history were reviewed and updated as appropriate: allergies, current medications, past family history, past medical history, past social history, past surgical history and problem list.  Review of Systems Pertinent items noted in HPI and remainder of comprehensive ROS otherwise negative.    Objective:   BP 104/71   Pulse (!) 102   Ht 5' (1.524 m)   Wt 230 lb (104.3 kg)    LMP 08/05/2019   Breastfeeding Yes   BMI 44.92 kg/m   VS reviewed, nursing note reviewed,  Constitutional: well developed, well nourished, no distress HEENT: normocephalic CV: normal rate Pulm/chest wall: normal effort Abdomen: soft Neuro: alert and oriented x 3 Skin: warm, dry Psych: affect normal   Assessment:   1. Chronic hypertension affecting pregnancy --? CHTN vs white coat syndrome prepregnancy. Pt was never diagnosed and was never on meds prior to or during pregnancy. --BP elevated at end of pregnancy, so IOL at 37.5 for HTN. --Pt taking Norvasc 10 and Zestril daily. BP low normal today. --Discussed with patient and with shared decision-making, pt to stop BP medications today.  BP check in our office in 1 week to determine next plan. --Encouraged pt to seek care with PCP, have BP checks regularly due to risk factors  2. Postpartum care following vaginal delivery --Doing well, bonding, good support at home.  Plan:   Essential components of care per ACOG recommendations:  1.  Mood and well being: Patient with negative depression screening today. Reviewed local resources for support.  - Patient does not use tobacco.  - hx of drug use? No   2. Infant care and feeding:  -Patient currently breastmilk feeding? Yes  If breastmilk feeding discussed return to work and pumping. If needed,  patient was provided letter for work to allow for every 2-3 hr pumping breaks, and to be granted a private location to express breastmilk and refrigerated area to store breastmilk. Reviewed importance of draining breast regularly to support lactation. -Social determinants of health (SDOH) reviewed in EPIC. No concerns  3. Sexuality, contraception and birth spacing - Patient does not want a pregnancy in the next year.   - Reviewed forms of contraception in tiered fashion. Patient desired condoms today.   - Discussed birth spacing of 18 months  4. Sleep and fatigue -Encouraged  family/partner/community support of 4 hrs of uninterrupted sleep to help with mood and fatigue  5. Physical Recovery  - Discussed patients delivery - Patient had a second degree laceration, perineal healing reviewed. Patient expressed understanding - Patient has urinary incontinence? No  - Patient is safe to resume physical and sexual activity  6.  Health Maintenance - Last pap smear done 10/23/19 and was normal with negative HPV.   7. Chronic Disease - PCP follow up for HTN  Sharen Counter, CNM [facility] [employer], [facility]

## 2020-06-09 ENCOUNTER — Ambulatory Visit (INDEPENDENT_AMBULATORY_CARE_PROVIDER_SITE_OTHER): Payer: 59 | Admitting: Advanced Practice Midwife

## 2020-06-09 ENCOUNTER — Other Ambulatory Visit: Payer: Self-pay

## 2020-06-09 ENCOUNTER — Encounter: Payer: Self-pay | Admitting: Advanced Practice Midwife

## 2020-06-09 VITALS — BP 104/71 | HR 102 | Ht 60.0 in | Wt 230.0 lb

## 2020-06-09 DIAGNOSIS — O10919 Unspecified pre-existing hypertension complicating pregnancy, unspecified trimester: Secondary | ICD-10-CM

## 2020-06-09 DIAGNOSIS — Z8759 Personal history of other complications of pregnancy, childbirth and the puerperium: Secondary | ICD-10-CM

## 2020-06-16 ENCOUNTER — Other Ambulatory Visit: Payer: Self-pay

## 2020-06-16 ENCOUNTER — Encounter: Payer: Self-pay | Admitting: *Deleted

## 2020-06-16 ENCOUNTER — Ambulatory Visit (INDEPENDENT_AMBULATORY_CARE_PROVIDER_SITE_OTHER): Payer: 59 | Admitting: *Deleted

## 2020-06-16 VITALS — BP 117/81 | HR 80

## 2020-06-16 DIAGNOSIS — Z3A35 35 weeks gestation of pregnancy: Secondary | ICD-10-CM

## 2020-06-16 DIAGNOSIS — O10919 Unspecified pre-existing hypertension complicating pregnancy, unspecified trimester: Secondary | ICD-10-CM

## 2020-06-16 NOTE — Progress Notes (Signed)
Pt here for BP check only  117/81 P-80.  Pt will return PRN

## 2020-06-23 ENCOUNTER — Ambulatory Visit: Payer: 59 | Admitting: Advanced Practice Midwife

## 2020-11-16 IMAGING — US US MFM OB FOLLOW-UP
1 series · 14 of 28 positions shown · non-contrast
Comparison: none

[Series 1: us mfm ob follow-up · 39 acquisitions, 14 frames shown]
[im 2/39]
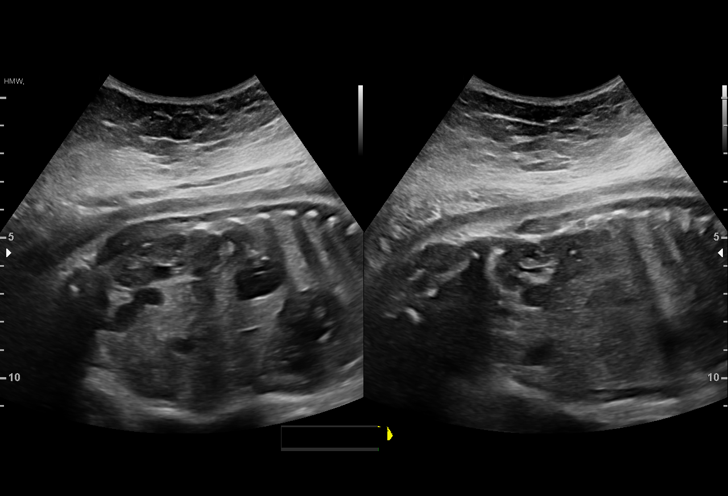
[im 5/39]
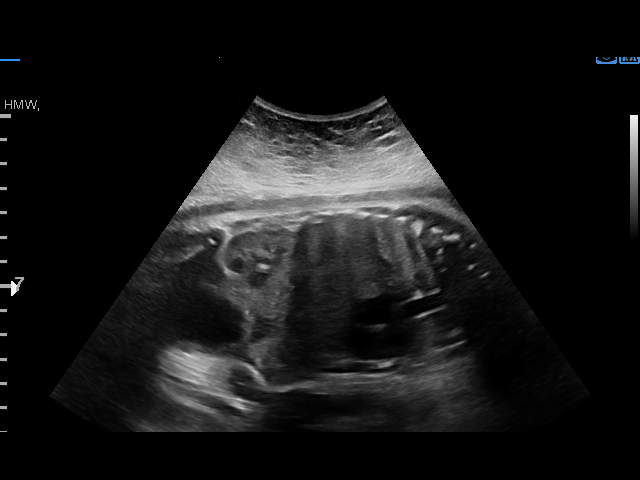
[im 8/39]
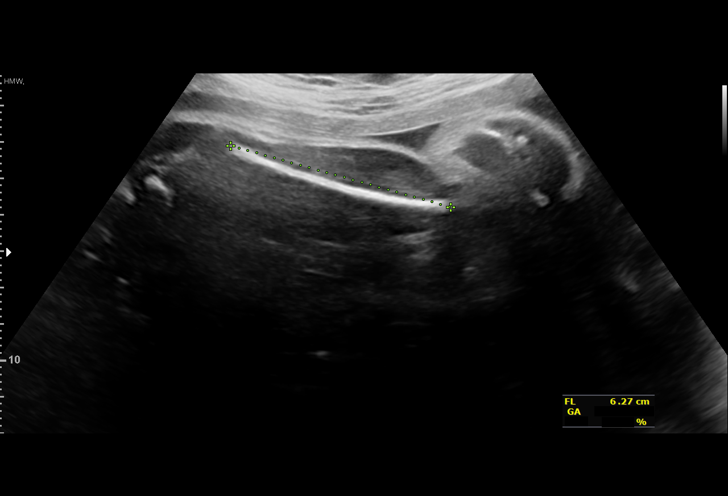
[im 10/39]
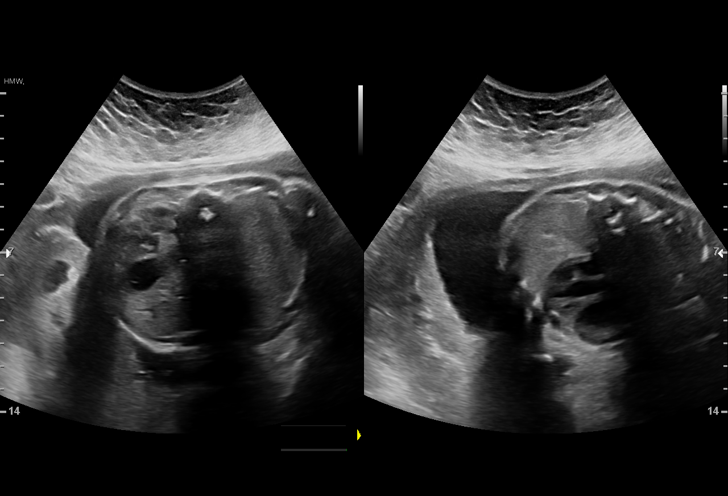
[im 13/39]
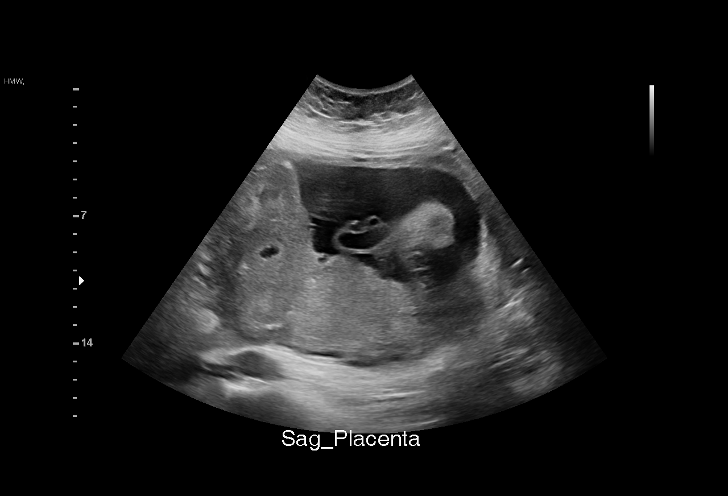
[im 16/39]
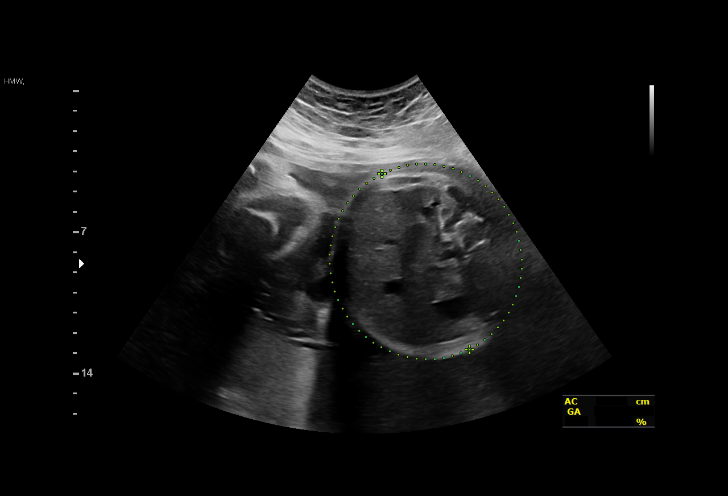
[im 19/39]
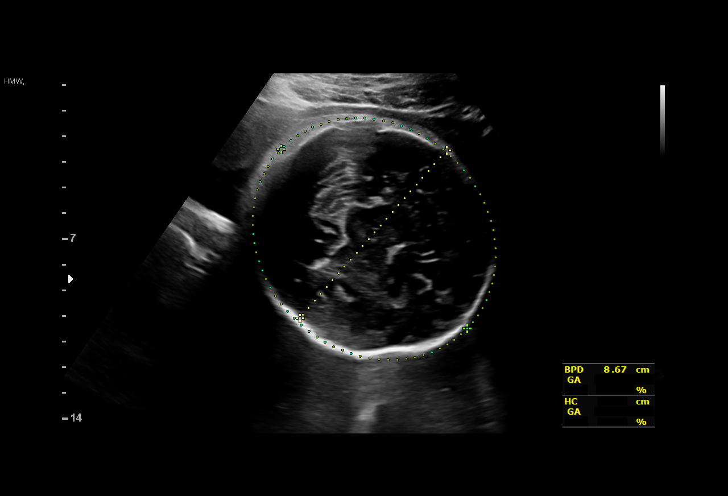
[im 22/39]
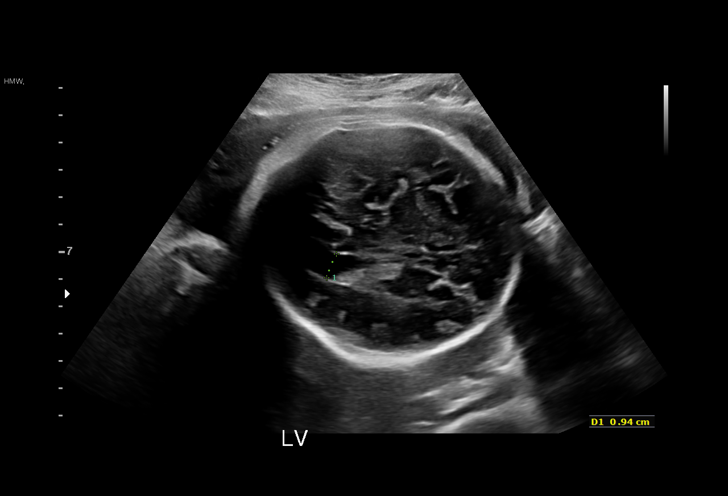
[im 24/39]
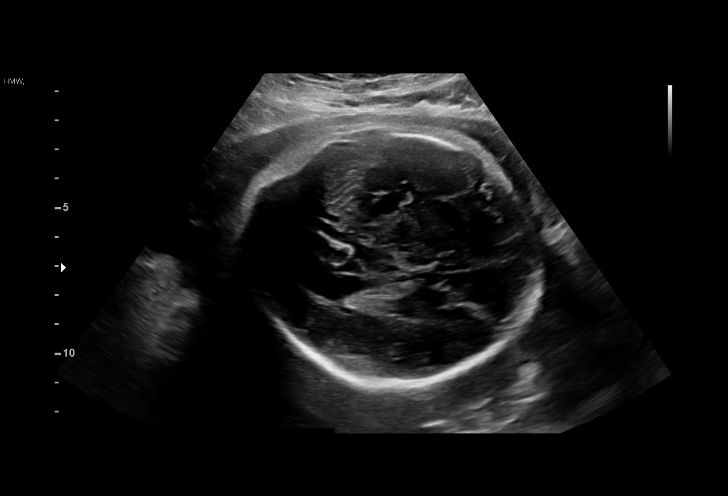
[im 27/39]
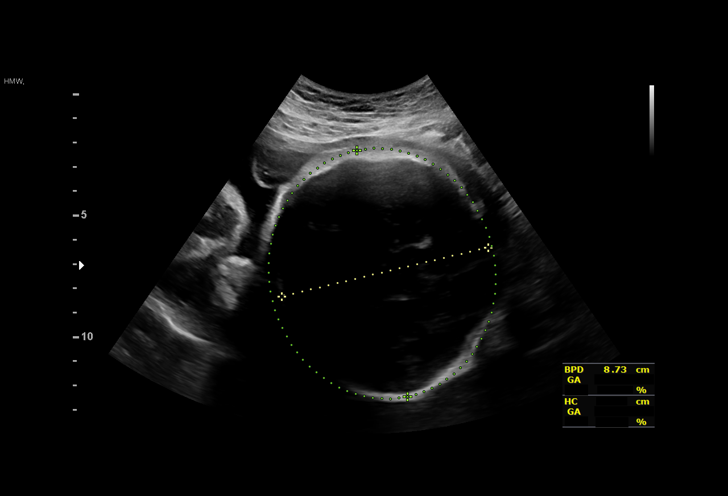
[im 30/39]
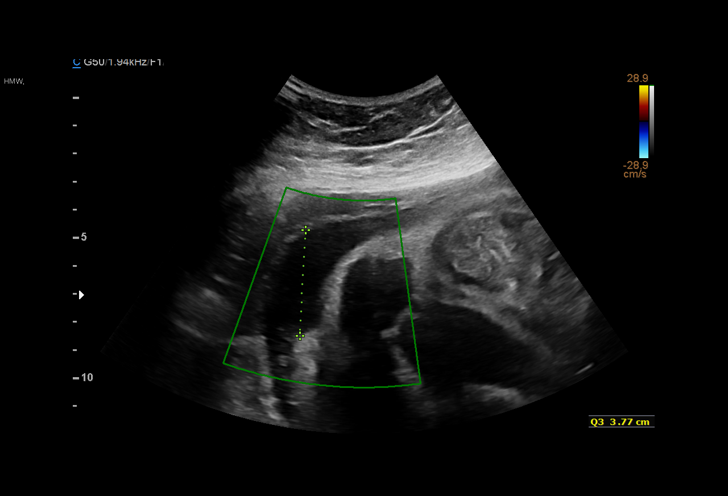
[im 33/39]
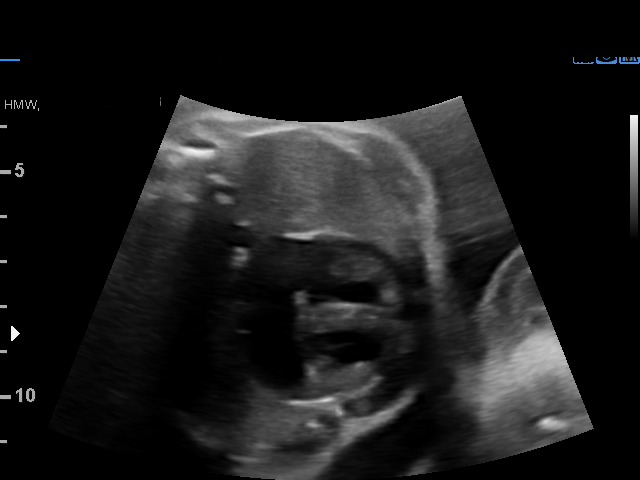
[im 36/39]
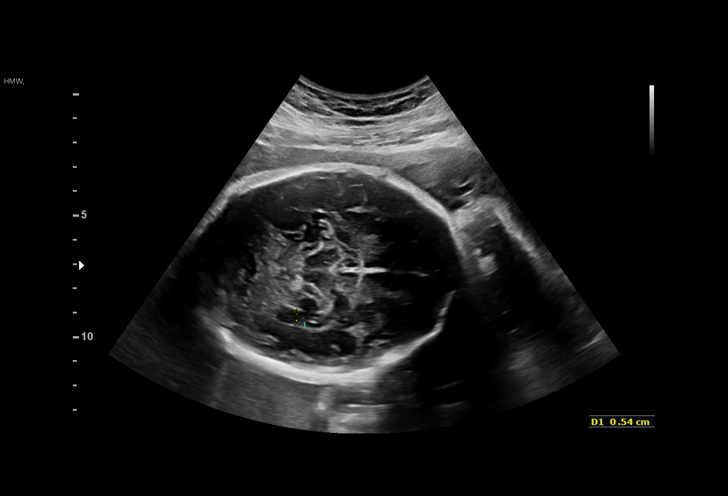
[im 39/39]
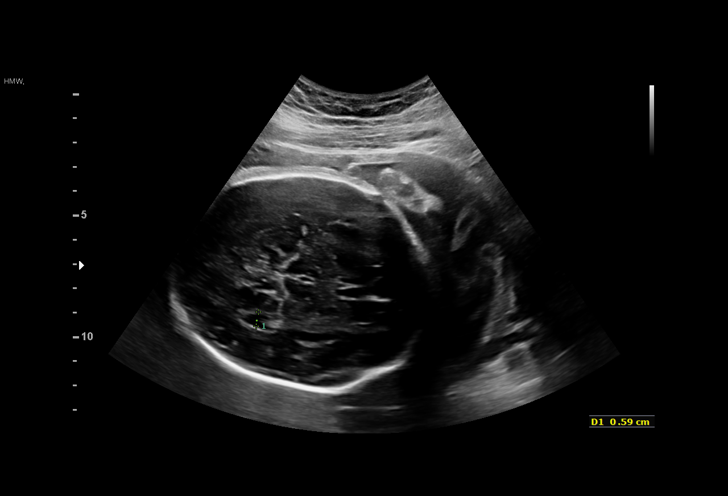

[14 of 28 positions shown; findings below may reference images not displayed]

Indications

 Hypertension - Chronic/Pre-existing (no
 meds)
 31 weeks gestation of pregnancy
 Obesity complicating pregnancy, second
 trimester
 Family history of congenital anomaly (Bau
 bifida, hypospadias)
 Encounter for other antenatal screening
 follow-up
Fetal Evaluation

 Num Of Fetuses:         1
 Fetal Heart Rate(bpm):  146
 Cardiac Activity:       Observed
 Presentation:           Cephalic
 Placenta:               Posterior
 P. Cord Insertion:      Previously Visualized

 Amniotic Fluid
 AFI FV:      Within normal limits

 AFI Sum(cm)     %Tile       Largest Pocket(cm)
 14.99           53

 RUQ(cm)       RLQ(cm)       LUQ(cm)        LLQ(cm)

Biometry
 BPD:      86.4  mm     G. Age:  34w 6d       > 99  %    CI:        81.35   %    70 - 86
                                                         FL/HC:      20.9   %    19.3 -
 HC:      302.4  mm     G. Age:  33w 4d         76  %    HC/AC:      1.03        0.96 -
 AC:      293.2  mm     G. Age:  33w 2d         93  %    FL/BPD:     73.0   %    71 - 87
 FL:       63.1  mm     G. Age:  32w 4d         74  %    FL/AC:      21.5   %    20 - 24

 Est. FW:    2242  gm    4 lb 12 oz      94  %
OB History

 Blood Type:   A+
 Gravidity:    1         Term:   0        Prem:   0        SAB:   0
 TOP:          0       Ectopic:  0        Living: 0
Gestational Age

 LMP:           33w 4d        Date:  08/05/19                 EDD:   05/11/20
 U/S Today:     33w 4d                                        EDD:   05/11/20
 Best:          31w 2d     Det. By:  Previous Ultrasound      EDD:   05/27/20
                                     (10/23/19)
Anatomy



 Other:  Fetus appears to be a male. Nasal bone visualized prev. Heels and
         5th digit prev. visualized. Open hands prev. visualized. Technically
         difficult due to maternal habitus and fetal position.
Cervix Uterus Adnexa

 Cervix
 Not visualized (advanced GA >58wks)

 Uterus
 No abnormality visualized.

 Right Ovary
 No adnexal mass visualized.

 Left Ovary
 No adnexal mass visualized.
 Cul De Sac
 No free fluid seen.

 Adnexa
 No abnormality visualized.
Impression

 Follow up growth performed secondary to maternal chronic
 hypertension on no meds.
 Normal interval growth with measurements consistent with
 dates.
 There is good fetal movement and amniotic.
Recommendations

 Follow up growth in 4 weeks.

## 2022-05-16 ENCOUNTER — Encounter: Payer: Self-pay | Admitting: Obstetrics and Gynecology
